# Patient Record
Sex: Female | Born: 1975 | Race: Black or African American | Hispanic: No | Marital: Single | Smoking: Never smoker
Health system: Southern US, Community
[De-identification: ages and names within clinical notes are randomized; demographics above are authoritative.]

## PROBLEM LIST (undated history)

## (undated) DIAGNOSIS — E7800 Pure hypercholesterolemia, unspecified: Secondary | ICD-10-CM

## (undated) DIAGNOSIS — D259 Leiomyoma of uterus, unspecified: Secondary | ICD-10-CM

## (undated) HISTORY — PX: TUBAL LIGATION: SHX77

## (undated) HISTORY — PX: REDUCTION MAMMAPLASTY: SUR839

---

## 2002-04-19 ENCOUNTER — Emergency Department (HOSPITAL_COMMUNITY): Admission: EM | Admit: 2002-04-19 | Discharge: 2002-04-19 | Payer: Self-pay | Admitting: Emergency Medicine

## 2003-11-05 ENCOUNTER — Ambulatory Visit (HOSPITAL_COMMUNITY): Admission: RE | Admit: 2003-11-05 | Discharge: 2003-11-05 | Payer: Self-pay | Admitting: Family Medicine

## 2006-05-07 ENCOUNTER — Emergency Department (HOSPITAL_COMMUNITY): Admission: EM | Admit: 2006-05-07 | Discharge: 2006-05-07 | Payer: Self-pay | Admitting: Emergency Medicine

## 2006-09-05 ENCOUNTER — Emergency Department (HOSPITAL_COMMUNITY): Admission: EM | Admit: 2006-09-05 | Discharge: 2006-09-05 | Payer: Self-pay | Admitting: Emergency Medicine

## 2006-11-19 ENCOUNTER — Encounter (INDEPENDENT_AMBULATORY_CARE_PROVIDER_SITE_OTHER): Payer: Self-pay | Admitting: Specialist

## 2006-11-19 ENCOUNTER — Other Ambulatory Visit: Admission: RE | Admit: 2006-11-19 | Discharge: 2006-11-19 | Payer: Self-pay | Admitting: Unknown Physician Specialty

## 2008-07-19 ENCOUNTER — Emergency Department (HOSPITAL_COMMUNITY): Admission: EM | Admit: 2008-07-19 | Discharge: 2008-07-19 | Payer: Self-pay | Admitting: Emergency Medicine

## 2009-03-26 ENCOUNTER — Emergency Department (HOSPITAL_COMMUNITY): Admission: EM | Admit: 2009-03-26 | Discharge: 2009-03-26 | Payer: Self-pay | Admitting: Emergency Medicine

## 2009-04-29 ENCOUNTER — Other Ambulatory Visit: Admission: RE | Admit: 2009-04-29 | Discharge: 2009-04-29 | Payer: Self-pay | Admitting: Obstetrics & Gynecology

## 2009-07-23 HISTORY — PX: MASS EXCISION: SHX2000

## 2009-07-27 ENCOUNTER — Ambulatory Visit (HOSPITAL_COMMUNITY): Admission: RE | Admit: 2009-07-27 | Discharge: 2009-07-27 | Payer: Self-pay | Admitting: Family Medicine

## 2009-10-08 ENCOUNTER — Emergency Department (HOSPITAL_COMMUNITY): Admission: EM | Admit: 2009-10-08 | Discharge: 2009-10-08 | Payer: Self-pay | Admitting: Emergency Medicine

## 2009-11-04 ENCOUNTER — Encounter: Payer: Self-pay | Admitting: Obstetrics & Gynecology

## 2009-11-04 ENCOUNTER — Inpatient Hospital Stay (HOSPITAL_COMMUNITY): Admission: RE | Admit: 2009-11-04 | Discharge: 2009-11-07 | Payer: Self-pay | Admitting: Obstetrics and Gynecology

## 2010-01-16 ENCOUNTER — Ambulatory Visit (HOSPITAL_COMMUNITY)
Admission: RE | Admit: 2010-01-16 | Discharge: 2010-01-16 | Payer: Self-pay | Source: Home / Self Care | Admitting: General Surgery

## 2010-10-08 LAB — BASIC METABOLIC PANEL
BUN: 12 mg/dL (ref 6–23)
CO2: 27 mEq/L (ref 19–32)
Calcium: 9.1 mg/dL (ref 8.4–10.5)
Chloride: 107 mEq/L (ref 96–112)
Creatinine, Ser: 1.02 mg/dL (ref 0.4–1.2)
GFR calc Af Amer: >60 mL/min (ref >60)
GFR calc non Af Amer: >60 mL/min (ref >60)
Glucose, Bld: 90 mg/dL (ref 70–99)
Potassium: 3.9 mEq/L (ref 3.5–5.1)
Sodium: 138 mEq/L (ref 135–145)

## 2010-10-08 LAB — CBC
HCT: 38.7 % (ref 36.0–46.0)
Hemoglobin: 13.1 g/dL (ref 12.0–15.0)
MCHC: 33.9 g/dL (ref 30.0–36.0)
MCV: 90.6 fL (ref 78.0–100.0)
Platelets: 204 10*3/uL (ref 150–400)
RBC: 4.28 MIL/uL (ref 3.87–5.11)
RDW: 13.8 % (ref 11.5–15.5)
WBC: 7.1 10*3/uL (ref 4.0–10.5)

## 2010-10-08 LAB — HCG, QUANTITATIVE, PREGNANCY: hCG, Beta Chain, Quant, S: <2 m[IU]/mL (ref <5)

## 2010-10-08 LAB — SURGICAL PCR SCREEN
MRSA, PCR: NEGATIVE
Staphylococcus aureus: NEGATIVE

## 2010-10-10 LAB — TYPE AND SCREEN
ABO/RH(D): O POS
Antibody Screen: NEGATIVE

## 2010-10-10 LAB — CBC
HCT: 32.1 % — ABNORMAL LOW (ref 36.0–46.0)
HCT: 32.2 % — ABNORMAL LOW (ref 36.0–46.0)
HCT: 34.4 % — ABNORMAL LOW (ref 36.0–46.0)
Hemoglobin: 10.6 g/dL — ABNORMAL LOW (ref 12.0–15.0)
Hemoglobin: 11.1 g/dL — ABNORMAL LOW (ref 12.0–15.0)
Hemoglobin: 11.8 g/dL — ABNORMAL LOW (ref 12.0–15.0)
MCHC: 32.9 g/dL (ref 30.0–36.0)
MCHC: 34.2 g/dL (ref 30.0–36.0)
MCHC: 34.6 g/dL (ref 30.0–36.0)
MCV: 94.6 fL (ref 78.0–100.0)
MCV: 94.9 fL (ref 78.0–100.0)
MCV: 96.3 fL (ref 78.0–100.0)
Platelets: 165 10*3/uL (ref 150–400)
Platelets: 182 10*3/uL (ref 150–400)
Platelets: 212 10*3/uL (ref 150–400)
RBC: 3.35 MIL/uL — ABNORMAL LOW (ref 3.87–5.11)
RBC: 3.4 MIL/uL — ABNORMAL LOW (ref 3.87–5.11)
RBC: 3.62 MIL/uL — ABNORMAL LOW (ref 3.87–5.11)
RDW: 14.2 % (ref 11.5–15.5)
RDW: 14.4 % (ref 11.5–15.5)
RDW: 14.4 % (ref 11.5–15.5)
WBC: 13.4 10*3/uL — ABNORMAL HIGH (ref 4.0–10.5)
WBC: 13.9 10*3/uL — ABNORMAL HIGH (ref 4.0–10.5)
WBC: 9.9 10*3/uL (ref 4.0–10.5)

## 2010-10-10 LAB — ABO/RH: ABO/RH(D): O POS

## 2010-10-10 LAB — RPR: RPR Ser Ql: NONREACTIVE

## 2010-10-16 LAB — URINALYSIS, ROUTINE W REFLEX MICROSCOPIC
Bilirubin Urine: NEGATIVE
Glucose, UA: NEGATIVE mg/dL
Hgb urine dipstick: NEGATIVE
Ketones, ur: NEGATIVE mg/dL
Nitrite: NEGATIVE
Protein, ur: NEGATIVE mg/dL
Specific Gravity, Urine: 1.01 (ref 1.005–1.030)
Urobilinogen, UA: 0.2 mg/dL (ref 0.0–1.0)
pH: 7 (ref 5.0–8.0)

## 2010-10-27 LAB — BASIC METABOLIC PANEL
BUN: 7 mg/dL (ref 6–23)
CO2: 25 mEq/L (ref 19–32)
Calcium: 9.7 mg/dL (ref 8.4–10.5)
Chloride: 104 mEq/L (ref 96–112)
Creatinine, Ser: 0.8 mg/dL (ref 0.4–1.2)
GFR calc Af Amer: >60 mL/min (ref >60)
GFR calc non Af Amer: >60 mL/min (ref >60)
Glucose, Bld: 93 mg/dL (ref 70–99)
Potassium: 3.4 mEq/L — ABNORMAL LOW (ref 3.5–5.1)
Sodium: 132 mEq/L — ABNORMAL LOW (ref 135–145)

## 2010-10-27 LAB — URINALYSIS, ROUTINE W REFLEX MICROSCOPIC
Bilirubin Urine: NEGATIVE
Glucose, UA: NEGATIVE mg/dL
Hgb urine dipstick: NEGATIVE
Ketones, ur: NEGATIVE mg/dL
Nitrite: NEGATIVE
Protein, ur: NEGATIVE mg/dL
Specific Gravity, Urine: >1.03 — ABNORMAL HIGH (ref 1.005–1.030)
Urobilinogen, UA: 1 mg/dL (ref 0.0–1.0)
pH: 6 (ref 5.0–8.0)

## 2010-10-27 LAB — DIFFERENTIAL
Basophils Absolute: 0 10*3/uL (ref 0.0–0.1)
Basophils Relative: 0 % (ref 0–1)
Eosinophils Absolute: 0.2 10*3/uL (ref 0.0–0.7)
Eosinophils Relative: 2 % (ref 0–5)
Lymphocytes Relative: 18 % (ref 12–46)
Lymphs Abs: 2.2 10*3/uL (ref 0.7–4.0)
Monocytes Absolute: 1 10*3/uL (ref 0.1–1.0)
Monocytes Relative: 8 % (ref 3–12)
Neutro Abs: 9.3 10*3/uL — ABNORMAL HIGH (ref 1.7–7.7)
Neutrophils Relative %: 73 % (ref 43–77)

## 2010-10-27 LAB — CBC
HCT: 40.6 % (ref 36.0–46.0)
Hemoglobin: 13.9 g/dL (ref 12.0–15.0)
MCHC: 34.3 g/dL (ref 30.0–36.0)
MCV: 92.7 fL (ref 78.0–100.0)
Platelets: 252 10*3/uL (ref 150–400)
RBC: 4.39 MIL/uL (ref 3.87–5.11)
RDW: 13.1 % (ref 11.5–15.5)
WBC: 12.8 10*3/uL — ABNORMAL HIGH (ref 4.0–10.5)

## 2010-10-27 LAB — WET PREP, GENITAL
Trich, Wet Prep: NONE SEEN
WBC, Wet Prep HPF POC: NONE SEEN
Yeast Wet Prep HPF POC: NONE SEEN

## 2010-10-27 LAB — HCG, QUANTITATIVE, PREGNANCY: hCG, Beta Chain, Quant, S: 57728 m[IU]/mL — ABNORMAL HIGH (ref <5)

## 2010-10-27 LAB — GC/CHLAMYDIA PROBE AMP, GENITAL
Chlamydia, DNA Probe: NEGATIVE
GC Probe Amp, Genital: NEGATIVE

## 2010-10-27 LAB — RPR: RPR Ser Ql: NONREACTIVE

## 2011-04-10 ENCOUNTER — Other Ambulatory Visit: Payer: Self-pay | Admitting: Obstetrics & Gynecology

## 2011-04-10 ENCOUNTER — Other Ambulatory Visit (HOSPITAL_COMMUNITY)
Admission: RE | Admit: 2011-04-10 | Discharge: 2011-04-10 | Disposition: A | Discharge status: Home / Self Care | Payer: Self-pay | Source: Ambulatory Visit | Attending: Obstetrics & Gynecology | Admitting: Obstetrics & Gynecology

## 2011-04-10 DIAGNOSIS — Z01419 Encounter for gynecological examination (general) (routine) without abnormal findings: Secondary | ICD-10-CM | POA: Insufficient documentation

## 2012-02-05 NOTE — H&P (Signed)
  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  Has been present for some time, but is increasing in size and causing her discomfort.  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  Review of Symptoms:  Constitutional:unremarkable   Head:unremarkable    Eyes:unremarkable   Nose/Mouth/Throat:unremarkable Cardiovascular:  unremarkable   Respiratory:unremarkable   Gastrointestinal:  unremarkable   Genitourinary:unremarkable     Musculoskeletal:unremarkable   Skin:unremarkable Hematolgic/Lymphatic:unremarkable     Allergic/Immunologic:unremarkable     Past Medical History:    Reviewed   Past Medical History  Surgical History: right axillary mass excision 2011 Medical Problems: unmarkable Allergies: nkda Medications: none   Social History:Reviewed  Social History  Preferred Language: English ([place]) Race:  Black or African American Ethnicity: Not Hispanic / Latino Age: 36 Years 9 Months Marital Status:  S Alcohol: unknown Recreational drug(s):  No   Smoking Status: Never smoker reviewed on 02/05/2012  Family History:  Reviewed   Family History  Is there a family history [id number]    Objective Information: General:  Well appearing, well nourished in no distress. Skin:     no rash or prominent lesions Heart:  RRR, no murmur Lungs:    CTA bilaterally, no wheezes, rhonchi, rales.  Breathing unlabored.     Left axilla with large diffuse subcutaneous mass present, no lymphadenopathy noted.  Assessment:Mass, left axilla  Diagnosis &amp; Procedure: DiagnosisCode: 239.2, ProcedureCode: 47829,    Plan:Scheduled for excision of mass, left axilla on 02/11/12.   Patient  Education:Alternative treatments to surgery were discussed with patient (and family).  Risks and benefits  of procedure were fully explained to the patient (and family) who gave informed consent. Patient/family questions were addressed.  Follow-up:Pending Surgery

## 2012-02-07 ENCOUNTER — Encounter (HOSPITAL_COMMUNITY)
Admission: RE | Admit: 2012-02-07 | Discharge: 2012-02-07 | Disposition: A | Discharge status: Home / Self Care | Payer: PRIVATE HEALTH INSURANCE | Source: Ambulatory Visit | Attending: General Surgery | Admitting: General Surgery

## 2012-02-07 ENCOUNTER — Encounter (HOSPITAL_COMMUNITY): Payer: Self-pay

## 2012-02-07 HISTORY — DX: Pure hypercholesterolemia, unspecified: E78.00

## 2012-02-07 LAB — CBC WITH DIFFERENTIAL/PLATELET
Basophils Absolute: 0 10*3/uL (ref 0.0–0.1)
Basophils Relative: 0 % (ref 0–1)
Eosinophils Absolute: 0.2 10*3/uL (ref 0.0–0.7)
Eosinophils Relative: 2 % (ref 0–5)
HCT: 38.4 % (ref 36.0–46.0)
Hemoglobin: 12.8 g/dL (ref 12.0–15.0)
Lymphocytes Relative: 28 % (ref 12–46)
Lymphs Abs: 2.5 10*3/uL (ref 0.7–4.0)
MCH: 30.6 pg (ref 26.0–34.0)
MCHC: 33.3 g/dL (ref 30.0–36.0)
MCV: 91.9 fL (ref 78.0–100.0)
Monocytes Absolute: 0.6 10*3/uL (ref 0.1–1.0)
Monocytes Relative: 7 % (ref 3–12)
Neutro Abs: 5.8 10*3/uL (ref 1.7–7.7)
Neutrophils Relative %: 63 % (ref 43–77)
Platelets: 282 10*3/uL (ref 150–400)
RBC: 4.18 MIL/uL (ref 3.87–5.11)
RDW: 12.9 % (ref 11.5–15.5)
WBC: 9.1 10*3/uL (ref 4.0–10.5)

## 2012-02-07 LAB — BASIC METABOLIC PANEL
BUN: 12 mg/dL (ref 6–23)
CO2: 26 mEq/L (ref 19–32)
Calcium: 9.9 mg/dL (ref 8.4–10.5)
Chloride: 104 mEq/L (ref 96–112)
Creatinine, Ser: 1 mg/dL (ref 0.50–1.10)
GFR calc Af Amer: 84 mL/min — ABNORMAL LOW (ref >90)
GFR calc non Af Amer: 72 mL/min — ABNORMAL LOW (ref >90)
Glucose, Bld: 101 mg/dL — ABNORMAL HIGH (ref 70–99)
Potassium: 4.1 mEq/L (ref 3.5–5.1)
Sodium: 138 mEq/L (ref 135–145)

## 2012-02-07 LAB — SURGICAL PCR SCREEN
MRSA, PCR: NEGATIVE
Staphylococcus aureus: POSITIVE — AB

## 2012-02-07 LAB — HCG, SERUM, QUALITATIVE: Preg, Serum: NEGATIVE

## 2012-02-07 MED ORDER — CHLORHEXIDINE GLUCONATE 4 % EX LIQD: 1.0000 "application " | Freq: Once | CUTANEOUS | Status: DC

## 2012-02-07 NOTE — Patient Instructions (Signed)
20 Chloe Orr  02/07/2012   Your procedure is scheduled on:  Monday, 02/11/12  Report to [facility] at 0730 AM.  Call this number if you have problems the morning of surgery: [phone]   Remember:   Do not eat food:After Midnight.  May have clear liquids:until Midnight .  Clear liquids include soda, tea, black coffee, apple or grape juice, broth.  Take these medicines the morning of surgery with A SIP OF WATER: none   Do not wear jewelry, make-up or nail polish.  Do not wear lotions, powders, or perfumes. You may wear deodorant.  Do not shave 48 hours prior to surgery. Men may shave face and neck.  Do not bring valuables to the hospital.  Contacts, dentures or bridgework may not be worn into surgery.  Leave suitcase in the car. After surgery it may be brought to your room.  For patients admitted to the hospital, checkout time is 11:00 AM the day of discharge.   Patients discharged the day of surgery will not be allowed to drive home.  Name and phone number of your driver: family  Special Instructions: CHG Shower Use Special Wash: 1/2 bottle night before surgery and 1/2 bottle morning of surgery.   Please read over the following fact sheets that you were given: Pain Booklet, MRSA Information, Surgical Site Infection Prevention, Anesthesia Post-op Instructions and Care and Recovery After Surgery PATIENT INSTRUCTIONS POST-ANESTHESIA  IMMEDIATELY FOLLOWING SURGERY:  Do not drive or operate machinery for the first twenty four hours after surgery.  Do not make any important decisions for twenty four hours after surgery or while taking narcotic pain medications or sedatives.  If you develop intractable nausea and vomiting or a severe headache please notify your doctor immediately.  FOLLOW-UP:  Please make an appointment with your surgeon as instructed. You do not need to follow up with anesthesia unless specifically instructed to do so.  WOUND CARE INSTRUCTIONS (if applicable):  Keep a  dry clean dressing on the anesthesia/puncture wound site if there is drainage.  Once the wound has quit draining you may leave it open to air.  Generally you should leave the bandage intact for twenty four hours unless there is drainage.  If the epidural site drains for more than 36-48 hours please call the anesthesia department.  QUESTIONS?:  Please feel free to call your physician or the hospital operator if you have any questions, and they will be happy to assist you.

## 2012-02-11 ENCOUNTER — Encounter (HOSPITAL_COMMUNITY): Payer: Self-pay | Admitting: Anesthesiology

## 2012-02-11 ENCOUNTER — Ambulatory Visit (HOSPITAL_COMMUNITY)
Admission: RE | Admit: 2012-02-11 | Discharge: 2012-02-11 | Disposition: A | Discharge status: Home / Self Care | Payer: PRIVATE HEALTH INSURANCE | Source: Ambulatory Visit | Attending: General Surgery | Admitting: General Surgery

## 2012-02-11 ENCOUNTER — Encounter (HOSPITAL_COMMUNITY)
Admission: RE | Disposition: A | Discharge status: Home / Self Care | Payer: Self-pay | Source: Ambulatory Visit | Attending: General Surgery

## 2012-02-11 ENCOUNTER — Encounter (HOSPITAL_COMMUNITY): Payer: Self-pay | Admitting: *Deleted

## 2012-02-11 ENCOUNTER — Ambulatory Visit (HOSPITAL_COMMUNITY): Payer: PRIVATE HEALTH INSURANCE | Admitting: Anesthesiology

## 2012-02-11 DIAGNOSIS — R229 Localized swelling, mass and lump, unspecified: Secondary | ICD-10-CM | POA: Insufficient documentation

## 2012-02-11 HISTORY — PX: MASS EXCISION: SHX2000

## 2012-02-11 SURGERY — EXCISION MASS
Anesthesia: General | Site: Axilla | Laterality: Left | Wound class: Clean

## 2012-02-11 MED ORDER — ONDANSETRON HCL 4 MG/2ML IJ SOLN
4.0000 mg | Freq: Once | INTRAMUSCULAR | Status: DC | PRN
Start: 2012-02-11 — End: 2012-02-11

## 2012-02-11 MED ORDER — KETOROLAC TROMETHAMINE 30 MG/ML IJ SOLN
INTRAMUSCULAR | Status: AC
  Filled 2012-02-11: qty 1

## 2012-02-11 MED ORDER — FENTANYL CITRATE 0.05 MG/ML IJ SOLN
INTRAMUSCULAR | Status: DC | PRN
  Administered 2012-02-11 (×6): 25 ug via INTRAVENOUS

## 2012-02-11 MED ORDER — BUPIVACAINE HCL (PF) 0.5 % IJ SOLN
INTRAMUSCULAR | Status: AC
  Filled 2012-02-11: qty 30

## 2012-02-11 MED ORDER — MIDAZOLAM HCL 2 MG/2ML IJ SOLN
INTRAMUSCULAR | Status: AC
  Filled 2012-02-11: qty 2

## 2012-02-11 MED ORDER — PROPOFOL 10 MG/ML IV EMUL
INTRAVENOUS | Status: DC | PRN
  Administered 2012-02-11: 180 mg via INTRAVENOUS

## 2012-02-11 MED ORDER — LACTATED RINGERS IV SOLN
INTRAVENOUS | Status: DC | PRN
  Administered 2012-02-11 (×2): via INTRAVENOUS

## 2012-02-11 MED ORDER — SODIUM CHLORIDE 0.9 % IR SOLN
Status: DC | PRN
  Administered 2012-02-11: 1000 mL

## 2012-02-11 MED ORDER — LACTATED RINGERS IV SOLN
INTRAVENOUS | Status: DC
  Administered 2012-02-11: 09:00:00 via INTRAVENOUS

## 2012-02-11 MED ORDER — PROPOFOL 10 MG/ML IV EMUL
INTRAVENOUS | Status: AC
  Filled 2012-02-11: qty 20

## 2012-02-11 MED ORDER — LIDOCAINE HCL (CARDIAC) 10 MG/ML IV SOLN
INTRAVENOUS | Status: DC | PRN
  Administered 2012-02-11: 50 mg via INTRAVENOUS

## 2012-02-11 MED ORDER — KETOROLAC TROMETHAMINE 30 MG/ML IJ SOLN
30.0000 mg | Freq: Once | INTRAMUSCULAR | Status: AC
  Administered 2012-02-11: 30 mg via INTRAVENOUS

## 2012-02-11 MED ORDER — FENTANYL CITRATE 0.05 MG/ML IJ SOLN
INTRAMUSCULAR | Status: AC
  Filled 2012-02-11: qty 2

## 2012-02-11 MED ORDER — BUPIVACAINE HCL (PF) 0.5 % IJ SOLN
INTRAMUSCULAR | Status: DC | PRN
  Administered 2012-02-11: 6 mL

## 2012-02-11 MED ORDER — FENTANYL CITRATE 0.05 MG/ML IJ SOLN
25.0000 ug | INTRAMUSCULAR | Status: DC | PRN
  Administered 2012-02-11 (×2): 50 ug via INTRAVENOUS

## 2012-02-11 MED ORDER — MUPIROCIN 2 % EX OINT
TOPICAL_OINTMENT | CUTANEOUS | Status: AC
  Filled 2012-02-11: qty 22

## 2012-02-11 MED ORDER — KETOROLAC TROMETHAMINE 30 MG/ML IJ SOLN: 30.0000 mg | Freq: Once | INTRAMUSCULAR | Status: DC

## 2012-02-11 MED ORDER — MIDAZOLAM HCL 2 MG/2ML IJ SOLN
1.0000 mg | INTRAMUSCULAR | Status: DC | PRN
Start: 2012-02-11 — End: 2012-02-11
  Administered 2012-02-11: 2 mg via INTRAVENOUS

## 2012-02-11 MED ORDER — LIDOCAINE HCL (PF) 1 % IJ SOLN
INTRAMUSCULAR | Status: AC
  Filled 2012-02-11: qty 5

## 2012-02-11 MED ORDER — ARTIFICIAL TEARS OP OINT
TOPICAL_OINTMENT | OPHTHALMIC | Status: AC
  Filled 2012-02-11: qty 3.5

## 2012-02-11 MED ORDER — OXYCODONE-ACETAMINOPHEN 7.5-325 MG PO TABS: 1.0000 | ORAL_TABLET | ORAL | Status: AC | PRN

## 2012-02-11 SURGICAL SUPPLY — 35 items
BAG HAMPER (MISCELLANEOUS) ×2 IMPLANT
BNDG COHESIVE 4X5 TAN STRL (GAUZE/BANDAGES/DRESSINGS) ×2 IMPLANT
BNDG CONFORM 6X.82 1P STRL (GAUZE/BANDAGES/DRESSINGS) ×2 IMPLANT
CLOTH BEACON ORANGE TIMEOUT ST (SAFETY) ×2 IMPLANT
COVER LIGHT HANDLE STERIS (MISCELLANEOUS) ×4 IMPLANT
DECANTER SPIKE VIAL GLASS SM (MISCELLANEOUS) ×2 IMPLANT
DERMABOND ADVANCED (GAUZE/BANDAGES/DRESSINGS) ×1
DERMABOND ADVANCED .7 DNX12 (GAUZE/BANDAGES/DRESSINGS) ×1 IMPLANT
DRAPE PROXIMA HALF (DRAPES) ×2 IMPLANT
DURAPREP 26ML APPLICATOR (WOUND CARE) ×4 IMPLANT
ELECT NEEDLE TIP 2.8 STRL (NEEDLE) IMPLANT
ELECT REM PT RETURN 9FT ADLT (ELECTROSURGICAL) ×2
ELECTRODE REM PT RTRN 9FT ADLT (ELECTROSURGICAL) ×1 IMPLANT
FORMALIN 10 PREFIL 120ML (MISCELLANEOUS) IMPLANT
GLOVE BIO SURGEON STRL SZ7.5 (GLOVE) ×2 IMPLANT
GLOVE BIOGEL PI IND STRL 7.0 (GLOVE) ×1 IMPLANT
GLOVE BIOGEL PI INDICATOR 7.0 (GLOVE) ×1
GLOVE ECLIPSE 6.5 STRL STRAW (GLOVE) ×2 IMPLANT
GLOVE EXAM NITRILE MD LF STRL (GLOVE) ×2 IMPLANT
GOWN STRL REIN XL XLG (GOWN DISPOSABLE) ×4 IMPLANT
KIT ROOM TURNOVER APOR (KITS) ×2 IMPLANT
MANIFOLD NEPTUNE II (INSTRUMENTS) ×2 IMPLANT
NEEDLE HYPO 25X1 1.5 SAFETY (NEEDLE) ×2 IMPLANT
NS IRRIG 1000ML POUR BTL (IV SOLUTION) ×2 IMPLANT
PACK MINOR (CUSTOM PROCEDURE TRAY) ×2 IMPLANT
PAD ARMBOARD 7.5X6 YLW CONV (MISCELLANEOUS) ×2 IMPLANT
SET BASIN LINEN APH (SET/KITS/TRAYS/PACK) ×2 IMPLANT
STOCKINETTE IMPERVIOUS LG (DRAPES) ×2 IMPLANT
SUT ETHILON 3 0 FSL (SUTURE) IMPLANT
SUT PROLENE 4 0 PS 2 18 (SUTURE) IMPLANT
SUT VIC AB 3-0 SH 27 (SUTURE) ×3
SUT VIC AB 3-0 SH 27X BRD (SUTURE) ×3 IMPLANT
SUT VIC AB 4-0 PS2 27 (SUTURE) ×4 IMPLANT
SYR BULB IRRIGATION 50ML (SYRINGE) ×2 IMPLANT
SYR CONTROL 10ML LL (SYRINGE) ×2 IMPLANT

## 2012-02-11 NOTE — Interval H&P Note (Signed)
History and Physical Interval Note:  02/11/2012 8:50 AM  Chloe Orr  has presented today for surgery, with the diagnosis of Mass of axilla  The various methods of treatment have been discussed with the patient and family. After consideration of risks, benefits and other options for treatment, the patient has consented to  Procedure(s) (LRB): EXCISION MASS (Left) as a surgical intervention .  The patient's history has been reviewed, patient examined, no change in status, stable for surgery.  I have reviewed the patient's chart and labs.  Questions were answered to the patient's satisfaction.     Franky Macho A

## 2012-02-11 NOTE — Anesthesia Preprocedure Evaluation (Signed)
Anesthesia Evaluation  Patient identified by MRN, date of birth, ID band Patient awake    History of Anesthesia Complications Negative for: history of anesthetic complications  Airway Mallampati: II      Dental  (+) Teeth Intact   Pulmonary neg pulmonary ROS,  breath sounds clear to auscultation        Cardiovascular negative cardio ROS  Rhythm:Regular     Neuro/Psych    GI/Hepatic negative GI ROS,   Endo/Other    Renal/GU      Musculoskeletal   Abdominal   Peds  Hematology   Anesthesia Other Findings   Reproductive/Obstetrics                           Anesthesia Physical Anesthesia Plan  ASA: I  Anesthesia Plan: General   Post-op Pain Management:    Induction: Intravenous  Airway Management Planned: LMA  Additional Equipment:   Intra-op Plan:   Post-operative Plan: Extubation in OR  Informed Consent: I have reviewed the patients History and Physical, chart, labs and discussed the procedure including the risks, benefits and alternatives for the proposed anesthesia with the patient or authorized representative who has indicated his/her understanding and acceptance.     Plan Discussed with:   Anesthesia Plan Comments:         Anesthesia Quick Evaluation

## 2012-02-11 NOTE — Anesthesia Procedure Notes (Signed)
Procedure Name: LMA Insertion Date/Time: 02/11/2012 9:26 AM Performed by: Carolyne Littles, AMY L Pre-anesthesia Checklist: Patient identified, Patient being monitored, Emergency Drugs available, Timeout performed and Suction available Patient Re-evaluated:Patient Re-evaluated prior to inductionOxygen Delivery Method: Circle system utilized Preoxygenation: Pre-oxygenation with 100% oxygen Intubation Type: IV induction Ventilation: Mask ventilation without difficulty LMA: LMA inserted LMA Size: 4.0 Number of attempts: 1 Placement Confirmation: positive ETCO2 and breath sounds checked- equal and bilateral Tube secured with: Tape Dental Injury: Teeth and Oropharynx as per pre-operative assessment

## 2012-02-11 NOTE — Transfer of Care (Signed)
Immediate Anesthesia Transfer of Care Note  Patient: Chloe Orr  Procedure(s) Performed: Procedure(s) (LRB): EXCISION MASS (Left)  Patient Location: PACU  Anesthesia Type: General  Level of Consciousness: awake, alert , oriented and patient cooperative  Airway & Oxygen Therapy: Patient Spontanous Breathing and Patient connected to face mask oxygen  Post-op Assessment: Report given to PACU RN and Post -op Vital signs reviewed and stable  Post vital signs: Reviewed and stable  Complications: No apparent anesthesia complications

## 2012-02-11 NOTE — Preoperative (Signed)
Beta Blockers   Reason not to administer Beta Blockers:Not Applicable 

## 2012-02-11 NOTE — Op Note (Signed)
Patient:  REIGNA RUPERTO  DOB:  [birth year]  MRN:  [id number]   Preop Diagnosis:  Neoplasm, left axilla  Postop Diagnosis:  Same  Procedure:  Excision of neoplasm, left axilla, greater than 4 cm  Surgeon:  Franky Macho, M.D.  Anes:  General  Indications:  Patient is a 36 year old black female who presents with an enlarging subcutaneous mass in the left axilla. The risks and benefits of the procedure including bleeding, infection, and recurrence of the mass were fully explained to the patient, gave informed consent.   Procedure note:  Patient was placed in the supine position. After general anesthesia was administered, left axilla was prepped and draped using usual sterile technique with DuraPrep. Surgical site confirmation was performed.  An incision was made in the left axilla. This was taken down to the subcutaneous tissue. Several areas of well formed pockets of adipose tissue and breast tissue were found. These were excised without difficulty and sent to pathology for further examination. They appear to be benign in nature. The wound was irrigated normal saline. The subcutaneous layer was reapproximated using 2-0 Vicryl interrupted suture. Excess skin was removed and the skin was closed using 4-0 Vicryl subcuticular suture. 0.5% Sensorcaine was instilled the surrounding wound. Dermabond was then applied.  All tape and needle counts were correct at the end of the procedure. Patient was awakened and transferred to PACU in stable condition.  Complications:  None  EBL:  Minimal  Specimen:  Left axillary tissue

## 2012-02-11 NOTE — Anesthesia Postprocedure Evaluation (Signed)
  Anesthesia Post-op Note  Patient: Chloe Orr  Procedure(s) Performed: Procedure(s) (LRB): EXCISION MASS (Left)  Patient Location: PACU  Anesthesia Type: General  Level of Consciousness: awake, alert , oriented and patient cooperative  Airway and Oxygen Therapy: Patient Spontanous Breathing and Patient connected to face mask oxygen  Post-op Pain: mild  Post-op Assessment: Post-op Vital signs reviewed, Patient's Cardiovascular Status Stable, Respiratory Function Stable, Patent Airway and No signs of Nausea or vomiting  Post-op Vital Signs: Reviewed and stable  Complications: No apparent anesthesia complications

## 2012-02-15 ENCOUNTER — Encounter (HOSPITAL_COMMUNITY): Payer: Self-pay | Admitting: General Surgery

## 2013-10-01 ENCOUNTER — Emergency Department (HOSPITAL_COMMUNITY)
Admission: EM | Admit: 2013-10-01 | Discharge: 2013-10-01 | Disposition: A | Discharge status: Home / Self Care | Payer: BC Managed Care – PPO | Source: Home / Self Care | Attending: Family Medicine | Admitting: Family Medicine

## 2013-10-01 ENCOUNTER — Encounter (HOSPITAL_COMMUNITY): Payer: Self-pay | Admitting: Emergency Medicine

## 2013-10-01 DIAGNOSIS — T148 Other injury of unspecified body region: Secondary | ICD-10-CM

## 2013-10-01 DIAGNOSIS — W57XXXA Bitten or stung by nonvenomous insect and other nonvenomous arthropods, initial encounter: Secondary | ICD-10-CM

## 2013-10-01 MED ORDER — CEPHALEXIN 500 MG PO CAPS: 500.0000 mg | ORAL_CAPSULE | Freq: Three times a day (TID) | ORAL | Status: DC

## 2013-10-01 MED ORDER — FLUTICASONE PROPIONATE 0.05 % EX CREA: TOPICAL_CREAM | Freq: Two times a day (BID) | CUTANEOUS | Status: DC

## 2013-10-01 NOTE — ED Provider Notes (Signed)
CSN: [id number]     Arrival date & time 10/01/13  1856 History   First MD Initiated Contact with Patient 10/01/13 1919     Chief Complaint  Patient presents with  . Insect Bite   (Consider location/radiation/quality/duration/timing/severity/associated sxs/prior Treatment) Patient is a 38 y.o. female presenting with rash. The history is provided by the patient.  Rash Location:  Head/neck Head/neck rash location:  R neck Quality: itchiness, redness and swelling   Severity:  Mild Onset quality:  Sudden Duration:  1 day Progression:  Unchanged Chronicity:  New Context: insect bite/sting   Associated symptoms: induration   Associated symptoms: no fever, no shortness of breath, no throat swelling and no tongue swelling     Past Medical History  Diagnosis Date  . Hypercholesteremia    Past Surgical History  Procedure Laterality Date  . Tubal ligation    . Cesarean section  1996, 2010    [facility]  . Mass excision  2011    right arm mass excision  . Mass excision  02/11/2012    Procedure: EXCISION MASS;  Surgeon: Jamesetta So, MD;  Location: [facility];  Service: General;  Laterality: Left;  Excision Neoplasm Left Axilla   History reviewed. No pertinent family history. History  Substance Use Topics  . Smoking status: Never Smoker   . Smokeless tobacco: Not on file  . Alcohol Use: Yes     Comment: rarely   OB History   Grav Para Term Preterm Abortions TAB SAB Ect Mult Living                 Review of Systems  Constitutional: Negative.  Negative for fever.  Respiratory: Negative for shortness of breath.   Skin: Positive for rash.    Allergies  Review of patient's allergies indicates no known allergies.  Home Medications   Current Outpatient Rx  Name  Route  Sig  Dispense  Refill  . cephALEXin (KEFLEX) 500 MG capsule   Oral   Take 1 capsule (500 mg total) by mouth 3 (three) times daily. Take all of medicine and drink lots of fluids   15 capsule   0   .  fluticasone (CUTIVATE) 0.05 % cream   Topical   Apply topically 2 (two) times daily.   30 g   0   . simvastatin (ZOCOR) 20 MG tablet   Oral   Take 20 mg by mouth at bedtime.          BP 143/88  Pulse 89  Temp(Src) 98.4 F (36.9 C) (Oral)  Resp 18  SpO2 98%  LMP 09/16/2013 Physical Exam  Nursing note and vitals reviewed. Constitutional: She is oriented to person, place, and time. She appears well-developed and well-nourished.  HENT:  Mouth/Throat: Oropharynx is clear and moist.  Neck: Normal range of motion. Neck supple.  Pulmonary/Chest: Breath sounds normal.  Lymphadenopathy:    She has no cervical adenopathy.  Neurological: She is alert and oriented to person, place, and time.  Skin: Skin is warm and dry. Rash noted.  2 lesions to right neck , indurated, pruritic, raised.sm central blister.    ED Course  Procedures (including critical care time) Labs Review Labs Reviewed - No data to display Imaging Review No results found.   MDM   1. Multiple insect bites        Billy Fischer, MD 10/01/13 2048

## 2013-10-01 NOTE — ED Notes (Signed)
C/O INSECT BITE ON RIGHT SIDE OF NECK SINCE YESTERDAY STATES AREA IS HOT, ITCHY, AND SWOLLEN CREAM AND ALCOHOL WAS USED AS [place]

## 2014-01-18 ENCOUNTER — Encounter (HOSPITAL_COMMUNITY): Payer: Self-pay | Admitting: Emergency Medicine

## 2014-01-18 ENCOUNTER — Emergency Department (HOSPITAL_COMMUNITY)
Admission: EM | Admit: 2014-01-18 | Discharge: 2014-01-18 | Disposition: A | Discharge status: Home / Self Care | Payer: BC Managed Care – PPO | Attending: Emergency Medicine | Admitting: Emergency Medicine

## 2014-01-18 DIAGNOSIS — H11429 Conjunctival edema, unspecified eye: Secondary | ICD-10-CM | POA: Insufficient documentation

## 2014-01-18 DIAGNOSIS — E7800 Pure hypercholesterolemia, unspecified: Secondary | ICD-10-CM | POA: Insufficient documentation

## 2014-01-18 DIAGNOSIS — H5710 Ocular pain, unspecified eye: Secondary | ICD-10-CM | POA: Insufficient documentation

## 2014-01-18 DIAGNOSIS — IMO0002 Reserved for concepts with insufficient information to code with codable children: Secondary | ICD-10-CM | POA: Insufficient documentation

## 2014-01-18 DIAGNOSIS — T4995XA Adverse effect of unspecified topical agent, initial encounter: Secondary | ICD-10-CM | POA: Insufficient documentation

## 2014-01-18 DIAGNOSIS — H11421 Conjunctival edema, right eye: Secondary | ICD-10-CM

## 2014-01-18 DIAGNOSIS — T7840XA Allergy, unspecified, initial encounter: Secondary | ICD-10-CM

## 2014-01-18 DIAGNOSIS — Z79899 Other long term (current) drug therapy: Secondary | ICD-10-CM | POA: Insufficient documentation

## 2014-01-18 MED ORDER — PREDNISONE 20 MG PO TABS: 60.0000 mg | ORAL_TABLET | Freq: Once | ORAL | Status: DC

## 2014-01-18 MED ORDER — FLUORESCEIN SODIUM 1 MG OP STRP
1.0000 | ORAL_STRIP | Freq: Once | OPHTHALMIC | Status: AC
  Administered 2014-01-18: 1 via OPHTHALMIC
  Filled 2014-01-18: qty 1

## 2014-01-18 MED ORDER — FAMOTIDINE 20 MG PO TABS: 20.0000 mg | ORAL_TABLET | Freq: Two times a day (BID) | ORAL | Status: DC

## 2014-01-18 MED ORDER — TETRACAINE HCL 0.5 % OP SOLN
1.0000 [drp] | Freq: Once | OPHTHALMIC | Status: AC
  Administered 2014-01-18: 2 [drp] via OPHTHALMIC
  Filled 2014-01-18: qty 2

## 2014-01-18 MED ORDER — NEOMYCIN-POLYMYXIN-HC 3.5-10000-1 OP SUSP: 4.0000 [drp] | Freq: Four times a day (QID) | OPHTHALMIC | Status: DC

## 2014-01-18 MED ORDER — PREDNISONE 20 MG PO TABS: 40.0000 mg | ORAL_TABLET | Freq: Every day | ORAL | Status: DC

## 2014-01-18 MED ORDER — DIPHENHYDRAMINE HCL 25 MG PO TABS: 25.0000 mg | ORAL_TABLET | Freq: Four times a day (QID) | ORAL | Status: DC | PRN

## 2014-01-18 MED ORDER — FAMOTIDINE 20 MG PO TABS
20.0000 mg | ORAL_TABLET | Freq: Once | ORAL | Status: AC
  Administered 2014-01-18: 20 mg via ORAL
  Filled 2014-01-18: qty 1

## 2014-01-18 MED ORDER — DEXAMETHASONE SODIUM PHOSPHATE 10 MG/ML IJ SOLN
10.0000 mg | Freq: Once | INTRAMUSCULAR | Status: AC
  Administered 2014-01-18: 10 mg via INTRAMUSCULAR
  Filled 2014-01-18: qty 1

## 2014-01-18 NOTE — ED Notes (Signed)
Pt reports approx one hour ago her right eye began to itch and she was scratching it. Reports its no longer itching or hurting but it "feels funny." pt has moderate swelling noted to her eye but also her sclera. Redness noted, denies vision changes.

## 2014-01-18 NOTE — Discharge Instructions (Signed)
1. Medications: prednisone, benadryl, Pepcid, cortisporin, usual home medications 2. Treatment: rest, drink plenty of fluids,  3. Follow Up: Please followup with your opthalmologist in 24 hours for discussion of your diagnoses and further evaluation after today's visit;  Allergies Allergies may happen from anything your body is sensitive to. This may be food, medicines, pollens, chemicals, and nearly anything around you in everyday life that produces allergens. An allergen is anything that causes an allergy producing substance. Heredity is often a factor in causing these problems. This means you may have some of the same allergies as your parents. Food allergies happen in all age groups. Food allergies are some of the most severe and life threatening. Some common food allergies are cow's milk, seafood, eggs, nuts, wheat, and soybeans. SYMPTOMS   Swelling around the mouth.  An itchy red rash or hives.  Vomiting or diarrhea.  Difficulty breathing. SEVERE ALLERGIC REACTIONS ARE LIFE-THREATENING. This reaction is called anaphylaxis. It can cause the mouth and throat to swell and cause difficulty with breathing and swallowing. In severe reactions only a trace amount of food (for example, peanut oil in a salad) may cause death within seconds. Seasonal allergies occur in all age groups. These are seasonal because they usually occur during the same season every year. They may be a reaction to molds, grass pollens, or tree pollens. Other causes of problems are house dust mite allergens, pet dander, and mold spores. The symptoms often consist of nasal congestion, a runny itchy nose associated with sneezing, and tearing itchy eyes. There is often an associated itching of the mouth and ears. The problems happen when you come in contact with pollens and other allergens. Allergens are the particles in the air that the body reacts to with an allergic reaction. This causes you to release allergic antibodies.  Through a chain of events, these eventually cause you to release histamine into the blood stream. Although it is meant to be protective to the body, it is this release that causes your discomfort. This is why you were given anti-histamines to feel better. If you are unable to pinpoint the offending allergen, it may be determined by skin or blood testing. Allergies cannot be cured but can be controlled with medicine. Hay fever is a collection of all or some of the seasonal allergy problems. It may often be treated with simple over-the-counter medicine such as diphenhydramine. Take medicine as directed. Do not drink alcohol or drive while taking this medicine. Check with your caregiver or package insert for child dosages. If these medicines are not effective, there are many new medicines your caregiver can prescribe. Stronger medicine such as nasal spray, eye drops, and corticosteroids may be used if the first things you try do not work well. Other treatments such as immunotherapy or desensitizing injections can be used if all else fails. Follow up with your caregiver if problems continue. These seasonal allergies are usually not life threatening. They are generally more of a nuisance that can often be handled using medicine. HOME CARE INSTRUCTIONS   If unsure what causes a reaction, keep a diary of foods eaten and symptoms that follow. Avoid foods that cause reactions.  If hives or rash are present:  Take medicine as directed.  You may use an over-the-counter antihistamine (diphenhydramine) for hives and itching as needed.  Apply cold compresses (cloths) to the skin or take baths in cool water. Avoid hot baths or showers. Heat will make a rash and itching worse.  If you are  severely allergic:  Following a treatment for a severe reaction, hospitalization is often required for closer follow-up.  Wear a medic-alert bracelet or necklace stating the allergy.  You and your family must learn how to  give adrenaline or use an anaphylaxis kit.  If you have had a severe reaction, always carry your anaphylaxis kit or EpiPen with you. Use this medicine as directed by your caregiver if a severe reaction is occurring. Failure to do so could have a fatal outcome. SEEK MEDICAL CARE IF:  You suspect a food allergy. Symptoms generally happen within 30 minutes of eating a food.  Your symptoms have not gone away within 2 days or are getting worse.  You develop new symptoms.  You want to retest yourself or your child with a food or drink you think causes an allergic reaction. Never do this if an anaphylactic reaction to that food or drink has happened before. Only do this under the care of a caregiver. SEEK IMMEDIATE MEDICAL CARE IF:   You have difficulty breathing, are wheezing, or have a tight feeling in your chest or throat.  You have a swollen mouth, or you have hives, swelling, or itching all over your body.  You have had a severe reaction that has responded to your anaphylaxis kit or an EpiPen. These reactions may return when the medicine has worn off. These reactions should be considered life threatening. MAKE SURE YOU:   Understand these instructions.  Will watch your condition.  Will get help right away if you are not doing well or get worse. Document Released: 10/02/2002 Document Revised: 11/03/2012 Document Reviewed: 03/08/2008 [facility] Patient Information 2015 [place], [place]. This information is not intended to replace advice given to you by your health care provider. Make sure you discuss any questions you have with your health care provider.

## 2014-01-18 NOTE — ED Provider Notes (Signed)
CSN: [id number]     Arrival date & time 01/18/14  1533 History  This chart was scribed for non-physician practitioner Theodis Sato, PA-C working with [place], DO by Eston Mould, ED Scribe. This patient was seen in room TR04C/TR04C and the patient's care was started at 5:49 PM .   Chief Complaint  Patient presents with  . Eye Problem   The history is provided by the patient. No language interpreter was used.   HPI Comments: Chloe Orr is a 38 y.o. female who presents to the Emergency Department complaining of sudden R eye problem that began 1 hour ago. Pt states her R eye began to itch 1 hour ago and states she began rubbing eye; she reports touching her dog before and afterwards. She attempted to take a nap but reports sx worsened. She now has swelling, redness, and sclera appears inverted to R eye. She reports having an EpiPen due to allergies with dust mites and weeds, but does not have it at home and did not take it. Denies having itching and pain at this moment. She also denies vision changes, facial swelling, SOB, swelling of her throat, difficulty breathing, and allergies to her dog.  Past Medical History  Diagnosis Date  . Hypercholesteremia    Past Surgical History  Procedure Laterality Date  . Tubal ligation    . Cesarean section  1996, 2010    [facility]  . Mass excision  2011    right arm mass excision  . Mass excision  02/11/2012    Procedure: EXCISION MASS;  Surgeon: Jamesetta So, MD;  Location: [facility];  Service: General;  Laterality: Left;  Excision Neoplasm Left Axilla   History reviewed. No pertinent family history. History  Substance Use Topics  . Smoking status: Never Smoker   . Smokeless tobacco: Not on file  . Alcohol Use: Yes     Comment: rarely   OB History   Grav Para Term Preterm Abortions TAB SAB Ect Mult Living                 Review of Systems  Constitutional: Negative for fever.  HENT: Negative for drooling and  facial swelling.   Eyes: Positive for pain, redness and itching. Negative for photophobia, discharge and visual disturbance.  Respiratory: Negative for chest tightness, shortness of breath and wheezing.   Cardiovascular: Negative for chest pain.  Gastrointestinal: Negative for nausea, vomiting and abdominal pain.  Skin: Negative for rash.  Allergic/Immunologic: Negative for immunocompromised state.  Neurological: Negative for light-headedness and headaches.  Hematological: Does not bruise/bleed easily.   Allergies  Review of patient's allergies indicates no known allergies.  Home Medications   Prior to Admission medications   Medication Sig Start Date End Date Taking? Authorizing Etoy Mcdonnell  diphenhydrAMINE (SOMINEX) 25 MG tablet Take 25 mg by mouth at bedtime as needed for itching. For infection in eye   Yes Historical Chenel Wernli, MD  simvastatin (ZOCOR) 20 MG tablet Take 20 mg by mouth at bedtime.   Yes Historical Jolyne Laye, MD  diphenhydrAMINE (BENADRYL) 25 MG tablet Take 1 tablet (25 mg total) by mouth every 6 (six) hours as needed for itching (Rash). 01/18/14   Hannah Muthersbaugh, PA-C  famotidine (PEPCID) 20 MG tablet Take 1 tablet (20 mg total) by mouth 2 (two) times daily. 01/18/14   Hannah Muthersbaugh, PA-C  neomycin-polymyxin-hydrocortisone (CORTISPORIN) 3.5-10000-1 ophthalmic suspension Place 4 drops into the left eye 4 (four) times daily. 01/18/14   Jarrett Soho Muthersbaugh, PA-C  predniSONE (DELTASONE) 20 MG tablet Take 2 tablets (40 mg total) by mouth daily. 01/18/14   Hannah Muthersbaugh, PA-C   BP 128/90  Pulse 81  Temp(Src) 98.6 F (37 C) (Oral)  Resp 18  SpO2 98%  LMP 01/12/2014  Physical Exam  Nursing note and vitals reviewed. Constitutional: She appears well-developed and well-nourished. No distress.  Awake, alert, nontoxic appearance  HENT:  Head: Normocephalic and atraumatic.  Mouth/Throat: Oropharynx is clear and moist. No oropharyngeal exudate.  Eyes: Conjunctivae,  EOM and lids are normal. Pupils are equal, round, and reactive to light. Lids are everted and swept, no foreign bodies found. Right eye exhibits chemosis. Right eye exhibits no discharge, no exudate and no hordeolum. No foreign body present in the right eye. Left eye exhibits no chemosis, no discharge, no exudate and no hordeolum. No foreign body present in the left eye. Right conjunctiva is not injected. Right conjunctiva has no hemorrhage. Left conjunctiva is not injected. No scleral icterus. Right eye exhibits normal extraocular motion. Left eye exhibits normal extraocular motion.  Slit lamp exam:      The right eye shows no corneal abrasion, no corneal flare, no corneal ulcer, no foreign body, no fluorescein uptake and no anterior chamber bulge.       The left eye shows no corneal abrasion, no corneal flare, no corneal ulcer, no foreign body, no fluorescein uptake and no anterior chamber bulge.    Swelling and very mild erythema without induration to the periorbital tissue of the R eye with associated chemosis without injection of the sclera. PERRL. EOM intact without pain or diplopia. She has no photophobia.    Visual Acuity: Bilateral Near: 20/20  Bilateral Distance: 40/20  R Near: 20/20  R Distance: 40/20 L Near: 20/20 L Distance: 70/20         Neck: Normal range of motion. Neck supple.  Patent airway. No stridor. Handling secretions without difficulty.  Cardiovascular: Normal rate, regular rhythm and intact distal pulses.   Pulmonary/Chest: Effort normal and breath sounds normal. No stridor. No respiratory distress. She has no wheezes.  No wheezing.  Abdominal: Soft. Bowel sounds are normal. She exhibits no mass. There is no tenderness. There is no rebound and no guarding.  Musculoskeletal: Normal range of motion. She exhibits no edema.  Neurological: She is alert.  Speech is clear and goal oriented Moves extremities without ataxia  Skin: Skin is warm and dry. She is not  diaphoretic.  No urticaria or hives to the face or extremities.  Psychiatric: She has a normal mood and affect.   ED Course  Procedures  DIAGNOSTIC STUDIES: Oxygen Saturation is 98% on RA, normal by my interpretation.    COORDINATION OF CARE: 5:49 PM-Discussed treatment plan which includes evaluate pt after receiving Prednisone, 50-mg of Benadryl and Pepcid in triage. Will discharge pt with Benadryl x 6/hours, Pepcid, Prednisone, and eye drops. Pt agreed to plan.   6:25 PM-Evaluated pts eye with Sherral Hammers Lamp; no scratches noted. Reccommended pt to F/U with Opthamologist. Pt states she is feeling better and has noticed she is able to "open her eyes wide". Advised pt to come to the ED if she presents with fevers, redness, or inability to see.  Labs Review Labs Reviewed - No data to display  Imaging Review No results found.   EKG Interpretation None     MDM   Final diagnoses:  Allergic reaction, initial encounter  Chemosis of right conjunctiva    Chloe Orr presents with chemosis  of the right eye after petting her dog then rubbing her eye.  Pt reports significant allergies to dust mites and other and her mental problems. She reports that initially her eye was itchy but is no longer. She denies blurred vision, diplopia or pain with extraocular movements.  Physical exam was significant chemosis of the right eye and soft tissue swelling of the. Little tissue without erythema or induration. No evidence of periorbital cellulitis or secondary infection. Will give prednisone, Benadryl, Pepcid and reassess.  5:10PM Pt reports she is feeling better.  Moderate decrease in the periorbital swelling and mild decrease in the chemosis.  Pt with normal vision in the right eye and decreased vision in the left eye at baseline. She does not wear contacts.    6:05PM Pt continues to feel better.  No evidence of abrasion or ulceration on fluorescein exam.  Pt to be d/c home with allergic reaction  treatments, cortisporin and strict instructions to f/u with opthalmology.  Patient re-evaluated prior to dc, is hemodynamically stable, in no respiratory distress, and denies the feeling of throat closing. Pt has been advised to take OTC benadryl & return to the ED if they have a mod-severe allergic rxn (s/s including throat closing, difficulty breathing, swelling of lips face or tongue). Pt is to follow up with their PCP. Pt is agreeable with plan & verbalizes understanding.   I have personally reviewed patient's vitals, nursing note and any pertinent labs or imaging.  I performed an undressed physical exam.    At this time, it has been determined that no acute conditions requiring further emergency intervention. The patient/guardian have been advised of the diagnosis and plan. I reviewed all labs and imaging including any potential incidental findings. We have discussed signs and symptoms that warrant return to the ED, such as those listed above.  Patient/guardian has voiced understanding and agreed to follow-up with the PCP or specialist in 24 hours.  Vital signs are stable at discharge.   BP 128/90  Pulse 81  Temp(Src) 98.6 F (37 C) (Oral)  Resp 18  SpO2 98%  LMP 01/12/2014        Abigail Butts, PA-C 01/19/14 [phone]

## 2014-01-19 NOTE — ED Provider Notes (Signed)
Medical screening examination/treatment/procedure(s) were performed by non-physician practitioner and as supervising physician I was immediately available for consultation/collaboration.   EKG Interpretation None        Delice Bison Ward, DO 01/19/14 1526

## 2014-09-02 ENCOUNTER — Encounter: Payer: Self-pay | Admitting: Gastroenterology

## 2014-09-02 ENCOUNTER — Encounter (INDEPENDENT_AMBULATORY_CARE_PROVIDER_SITE_OTHER): Payer: Self-pay

## 2014-09-02 ENCOUNTER — Ambulatory Visit (INDEPENDENT_AMBULATORY_CARE_PROVIDER_SITE_OTHER): Payer: Self-pay | Admitting: Gastroenterology

## 2014-09-02 VITALS — BP 145/93 | HR 98 | Temp 98.0°F | Ht 59.0 in | Wt 157.8 lb

## 2014-09-02 DIAGNOSIS — B029 Zoster without complications: Secondary | ICD-10-CM

## 2014-09-02 MED ORDER — GABAPENTIN 100 MG PO CAPS: ORAL_CAPSULE | ORAL | Status: DC

## 2014-09-02 MED ORDER — PROMETHAZINE HCL 12.5 MG PO TABS: ORAL_TABLET | ORAL | Status: DC

## 2014-09-02 NOTE — Progress Notes (Signed)
   Subjective:    Patient ID: Chloe Orr, female    DOB: [birth year], 39 y.o.   MRN: [id number]  Delman Cheadle, PA-C  HPI BURNING PAIN IN LOWER ABD PAIN FOR 7 DAYS AND HAD BLISTERS OF HER STOMACH/LEG AND LEFT ARM. GIVEN MEDS FOR SHINGLES AND TOOK IT FOR 4 DAYS. STOPPED IT BECAUSE HER STOMACH WAS STILL HURTING. BEEN OFF SINCE MON OR TUE. TOOK A LAXATIVE CAUSE SHE THOUGHT SHE WAS BACKED UP BUT NOW SHE HAS LOOSE STOOLS. THIS MORNING SHE THREW UP. NAUSEA: NOT REALLY. VOMITED THIS AM(NO BLOOD). FEELS LIKE HEARTBURN BUT ITS DOWN BELOW. STRESSED OUT. SHE'S HUNGRY BUT HER STOMACH. HAS OXYCODONE & IBUPROFEN FOR PAIN.  PT DENIES FEVER, CHILLS, HEMATOCHEZIA,  nausea, melena, CHEST PAIN, SHORTNESS OF BREATH,  CHANGE IN BOWEL IN HABITS, constipation, problems swallowing, problems with sedation, heartburn or indigestion.  Past Medical History  Diagnosis Date  . Hypercholesteremia    Past Surgical History  Procedure Laterality Date  . Tubal ligation    . Cesarean section  1996, 2010    [facility]  . Mass excision  2011    right arm mass excision  . Mass excision  02/11/2012    Procedure: EXCISION MASS;  Surgeon: Jamesetta So, MD;  Location: [facility];  Service: General;  Laterality: Left;  Excision Neoplasm Left Axilla   No Known Allergies  Current Outpatient Prescriptions  Medication Sig Dispense Refill  . diphenhydrAMINE (BENADRYL) 25 MG tablet Take 1 tablet (25 mg total) by mouth every 6 (six) hours as needed for itching (Rash).    . simvastatin (ZOCOR) 20 MG tablet Take 20 mg by mouth at bedtime.    .      .      .      .       Review of Systems PER HPI OTHERWISE ALL SYSTEMS ARE NEGATIVE. LMP-JAN 2016     Objective:   Physical Exam  Constitutional: She is oriented to person, place, and time. She appears well-developed and well-nourished. No distress.  HENT:  Head: Normocephalic and atraumatic.  Mouth/Throat: Oropharynx is clear and moist. No oropharyngeal exudate.  Eyes:  Pupils are equal, round, and reactive to light. No scleral icterus.  Neck: Normal range of motion. Neck supple.  Cardiovascular: Normal rate, regular rhythm and normal heart sounds.   Pulmonary/Chest: Effort normal and breath sounds normal. No respiratory distress.  Abdominal: Soft. Bowel sounds are normal. She exhibits no distension. There is no tenderness.  Musculoskeletal: She exhibits no edema.  Lymphadenopathy:    She has no cervical adenopathy.  Neurological: She is alert and oriented to person, place, and time.  Skin: Rash noted.  FREQUENT ERUPTIONS W/O ERYTHEMA ON LEFT LOWER QUADRANT  Psychiatric: She has a normal mood and affect.  Vitals reviewed.         Assessment & Plan:

## 2014-09-02 NOTE — Patient Instructions (Signed)
TAKE DEXILANT ONCE A DAY FOR 2 WEEKS.  TAKE IBUPROFEN EVERY 6 HOURS FOR 5 DAYS WITH FOOD OR CRACKERS.  TAKE NEURONTIN 100 MG THREE TIMES DAY FOR 7 DAYS. IT MAY CAUSE DROWSINESS.  COMPLETE VALTREX.  USE PHENERGAN AS NEEDED FOR NAUSEA OR VOMITING.  PLEASE CALL WITH QUESTIONS OR CONCERNS.

## 2014-09-02 NOTE — Assessment & Plan Note (Addendum)
SX IMPROVING BUT PAIN NOT CONTROLLED.   TAKE DEXILANT ONCE A DAY FOR 2 WEEKS. TAKE IBUPROFEN EVERY 6 HOURS FOR 5 DAYS WITH FOOD OR CRACKERS. TAKE NEURONTIN 100 MG THREE TIMES DAY FOR 7 DAYS. IT MAY CAUSE DROWSINESS. COMPLETE VALTREX. USE PHENERGAN AS NEEDED FOR NAUSEA OR VOMITING. CALL WITH QUESTIONS OR CONCERNS.

## 2014-09-11 NOTE — Progress Notes (Signed)
CC'ED TO PCP 

## 2015-07-20 ENCOUNTER — Emergency Department (HOSPITAL_COMMUNITY)
Admission: EM | Admit: 2015-07-20 | Discharge: 2015-07-20 | Disposition: A | Discharge status: Home / Self Care | Payer: BLUE CROSS/BLUE SHIELD | Attending: Emergency Medicine | Admitting: Emergency Medicine

## 2015-07-20 ENCOUNTER — Encounter (HOSPITAL_COMMUNITY): Payer: Self-pay | Admitting: Emergency Medicine

## 2015-07-20 DIAGNOSIS — S40861A Insect bite (nonvenomous) of right upper arm, initial encounter: Secondary | ICD-10-CM | POA: Insufficient documentation

## 2015-07-20 DIAGNOSIS — Y998 Other external cause status: Secondary | ICD-10-CM | POA: Insufficient documentation

## 2015-07-20 DIAGNOSIS — Y9289 Other specified places as the place of occurrence of the external cause: Secondary | ICD-10-CM | POA: Diagnosis not present

## 2015-07-20 DIAGNOSIS — S50861A Insect bite (nonvenomous) of right forearm, initial encounter: Secondary | ICD-10-CM | POA: Insufficient documentation

## 2015-07-20 DIAGNOSIS — E7800 Pure hypercholesterolemia, unspecified: Secondary | ICD-10-CM | POA: Insufficient documentation

## 2015-07-20 DIAGNOSIS — W57XXXA Bitten or stung by nonvenomous insect and other nonvenomous arthropods, initial encounter: Secondary | ICD-10-CM | POA: Diagnosis not present

## 2015-07-20 DIAGNOSIS — Y9389 Activity, other specified: Secondary | ICD-10-CM | POA: Diagnosis not present

## 2015-07-20 MED ORDER — SULFAMETHOXAZOLE-TRIMETHOPRIM 800-160 MG PO TABS
1.0000 | ORAL_TABLET | Freq: Once | ORAL | Status: AC
  Administered 2015-07-20: 1 via ORAL
  Filled 2015-07-20 (×2): qty 1

## 2015-07-20 MED ORDER — SULFAMETHOXAZOLE-TRIMETHOPRIM 800-160 MG PO TABS: 1.0000 | ORAL_TABLET | Freq: Two times a day (BID) | ORAL | Status: AC

## 2015-07-20 MED ORDER — DIPHENHYDRAMINE HCL 50 MG/ML IJ SOLN
25.0000 mg | Freq: Once | INTRAMUSCULAR | Status: AC
  Administered 2015-07-20: 25 mg via INTRAMUSCULAR
  Filled 2015-07-20: qty 1

## 2015-07-20 NOTE — ED Notes (Signed)
Patient complaining of swelling and redness noted to right forearm since yesterday. Area is warm to touch. Also has redness noted to upper right arm and underneath right arm.

## 2015-07-20 NOTE — Discharge Instructions (Signed)
Cellulitis Cellulitis is an infection of the skin and the tissue beneath it. The infected area is usually red and tender. Cellulitis occurs most often in the arms and lower legs.  CAUSES  Cellulitis is caused by bacteria that enter the skin through cracks or cuts in the skin. The most common types of bacteria that cause cellulitis are staphylococci and streptococci. SIGNS AND SYMPTOMS   Redness and warmth.  Swelling.  Tenderness or pain.  Fever. DIAGNOSIS  Your health care provider can usually determine what is wrong based on a physical exam. Blood tests may also be done. TREATMENT  Treatment usually involves taking an antibiotic medicine. HOME CARE INSTRUCTIONS   Take your antibiotic medicine as directed by your health care provider. Finish the antibiotic even if you start to feel better.  Keep the infected arm or leg elevated to reduce swelling.  Apply a warm cloth to the affected area up to 4 times per day to relieve pain.  Take medicines only as directed by your health care provider.  Keep all follow-up visits as directed by your health care provider. SEEK MEDICAL CARE IF:   You notice red streaks coming from the infected area.  Your red area gets larger or turns dark in color.  Your bone or joint underneath the infected area becomes painful after the skin has healed.  Your infection returns in the same area or another area.  You notice a swollen bump in the infected area.  You develop new symptoms.  You have a fever. SEEK IMMEDIATE MEDICAL CARE IF:   You feel very sleepy.  You develop vomiting or diarrhea.  You have a general ill feeling (malaise) with muscle aches and pains.   This information is not intended to replace advice given to you by your health care provider. Make sure you discuss any questions you have with your health care provider.   Document Released: 04/18/2005 Document Revised: 03/30/2015 Document Reviewed: 09/24/2011 Elsevier Interactive  Patient Education 2016 [place] entire course of the antibiotics prescribed, taking your next dose tomorrow morning.  Applying ice or cool compresses can also help with itching.  Try to avoid rubbing the site as this can make it worse.  I suspect you may have been bit by bed bugs, see the information below about this insect.  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

## 2015-07-21 NOTE — ED Provider Notes (Signed)
CSN: [id number]     Arrival date & time 07/20/15  1812 History   First MD Initiated Contact with Patient 07/20/15 1957     Chief Complaint  Patient presents with  . Abscess     (Consider location/radiation/quality/duration/timing/severity/associated sxs/prior Treatment) The history is provided by the patient.   Chloe Orr is a 39 y.o. female with no significant past medical history presenting with 2 distinct suspected insect bites on her right forearm and upper arm.  She noticed these lesions yesterday which were initially itchy, but now has developed worsened pain and expanding surrounding redness.  She denies seeing the suspected culprit insect.  She denies fevers, chills, nausea, abdominal pain.  She has taken benadryl for the itching with transient improvement in the itching.     Past Medical History  Diagnosis Date  . Hypercholesteremia    Past Surgical History  Procedure Laterality Date  . Tubal ligation    . Cesarean section  1996, 2010    [facility]  . Mass excision  2011    right arm mass excision  . Mass excision  02/11/2012    Procedure: EXCISION MASS;  Surgeon: Jamesetta So, MD;  Location: [facility];  Service: General;  Laterality: Left;  Excision Neoplasm Left Axilla   History reviewed. No pertinent family history. Social History  Substance Use Topics  . Smoking status: Never Smoker   . Smokeless tobacco: None  . Alcohol Use: Yes     Comment: 1/2 beer daily   OB History    No data available     Review of Systems  Constitutional: Negative for fever and chills.  Respiratory: Negative for shortness of breath and wheezing.   Skin: Positive for color change and wound.  Neurological: Negative for numbness.      Allergies  Review of patient's allergies indicates no known allergies.  Home Medications   Prior to Admission medications   Medication Sig Start Date End Date Taking? Authorizing Provider  simvastatin (ZOCOR) 20 MG tablet Take 20 mg by  mouth at bedtime.   Yes Historical Provider, MD  diphenhydrAMINE (BENADRYL) 25 MG tablet Take 1 tablet (25 mg total) by mouth every 6 (six) hours as needed for itching (Rash). Patient not taking: Reported on 07/20/2015 01/18/14   Jarrett Soho Muthersbaugh, PA-C  diphenhydrAMINE (SOMINEX) 25 MG tablet Take 25 mg by mouth at bedtime as needed for itching. For infection in eye    Historical Provider, MD  gabapentin (NEURONTIN) 100 MG capsule 1 po tid for 7 days Patient not taking: Reported on 07/20/2015 09/02/14   Danie Binder, MD  promethazine (PHENERGAN) 12.5 MG tablet 1-2 po q4-6 h prn nausea or vomiting Patient not taking: Reported on 07/20/2015 09/02/14   Danie Binder, MD  sulfamethoxazole-trimethoprim (BACTRIM DS,SEPTRA DS) 800-160 MG tablet Take 1 tablet by mouth 2 (two) times daily. 07/20/15 07/27/15  Evalee Jefferson, PA-C   BP 138/90 mmHg  Pulse 82  Temp(Src) 98.6 F (37 C) (Oral)  Resp 18  Ht 4\' 11"  (1.499 m)  Wt 70.308 kg  BMI 31.29 kg/m2  SpO2 100%  LMP 07/20/2015 Physical Exam  Constitutional: She appears well-developed and well-nourished. No distress.  HENT:  Head: Normocephalic.  Neck: Neck supple.  Cardiovascular: Normal rate.   Pulmonary/Chest: Effort normal. She has no wheezes.  Musculoskeletal: Normal range of motion. She exhibits no edema.  Skin: There is erythema.  2 raised indurated papules approx 0.5 cm without drainage, no pustule, vesicle or punctum, one on  right forearm the other on medial upper arm.  3 cm surrounding erythema.  No red streaking.  No axillary adenopathy or pain. Skin intact. Blanching. No necrosis or central ulceration.    ED Course  Procedures (including critical care time) Labs Review Labs Reviewed - No data to display  Imaging Review No results found. I have personally reviewed and evaluated these images and lab results as part of my medical decision-making.   EKG Interpretation None      MDM   Final diagnoses:  Infected insect bite     Suspect possible inflammatory reaction to insect bites, however, possible early cellulitis.  Pt was covered with bactrim, advised continued benadryl prn, warm compresses.  F/u with pcp for recheck if not improved with tx.    Evalee Jefferson, PA-C 07/21/15 1343  Varney Biles, MD 07/21/15 [phone]

## 2015-10-05 ENCOUNTER — Encounter: Payer: Self-pay | Admitting: Obstetrics & Gynecology

## 2015-10-05 ENCOUNTER — Ambulatory Visit (INDEPENDENT_AMBULATORY_CARE_PROVIDER_SITE_OTHER): Payer: BLUE CROSS/BLUE SHIELD | Admitting: Obstetrics and Gynecology

## 2015-10-05 ENCOUNTER — Encounter: Payer: Self-pay | Admitting: Obstetrics and Gynecology

## 2015-10-05 VITALS — BP 120/80 | Ht 59.0 in | Wt 172.5 lb

## 2015-10-05 DIAGNOSIS — D251 Intramural leiomyoma of uterus: Secondary | ICD-10-CM | POA: Diagnosis not present

## 2015-10-05 DIAGNOSIS — D259 Leiomyoma of uterus, unspecified: Secondary | ICD-10-CM | POA: Insufficient documentation

## 2015-10-05 DIAGNOSIS — N921 Excessive and frequent menstruation with irregular cycle: Secondary | ICD-10-CM

## 2015-10-05 NOTE — Progress Notes (Signed)
Patient ID: Chloe Orr, female   DOB: [birth year], 40 y.o.   MRN: [id number] Pt here today for heavy bleeding with her periods. Pt states that she has a history of fibroids and thinks that they may be back.

## 2015-10-05 NOTE — Progress Notes (Signed)
Patient ID: Chloe Orr, female   DOB: [birth year], 40 y.o.   MRN: [id number]   [facility] Visit  Patient name: Chloe Orr MRN [id number]  Date of birth: 05/31/1976  CC & HPI:  Chloe Orr is a 40 y.o. female s/p tubal ligation presenting today for menorrhagia. Pt reports her periods are heavy with clotting for 5 days followed by a week of no bleeding and 5 days of spotting. She states her iron was low at a recent doctors visit and was prescribed 5 days of megace with moderate relief of heavy bleeding. She reports that she is currently spotting after taking the megace. Pt states she believes she was told her fibroids were reduced during cesarean section 5 years ago. She denies weight loss or gain, abdominal masses. She is sexually active with one partner. Pt notes she is UTD on pap smears.   ROS:  A complete 10 system review of systems was obtained and all systems are negative except as noted in the HPI and PMH.    Pertinent History Reviewed:   Reviewed: Significant for hypercholesterolemia, tubal ligations, cesarean section, uterine fibroids   Medical         Past Medical History  Diagnosis Date  . Hypercholesteremia                               Surgical Hx:    Past Surgical History  Procedure Laterality Date  . Tubal ligation    . Cesarean section  1996, 2010    [facility]  . Mass excision  2011    right arm mass excision  . Mass excision  02/11/2012    Procedure: EXCISION MASS;  Surgeon: Jamesetta So, MD;  Location: [facility];  Service: General;  Laterality: Left;  Excision Neoplasm Left Axilla   Medications: Reviewed & Updated - see associated section                       Current outpatient prescriptions:  .  diphenhydrAMINE (BENADRYL) 25 MG tablet, Take 1 tablet (25 mg total) by mouth every 6 (six) hours as needed for itching (Rash)., Disp: 30 tablet, Rfl: 0 .  Fish Oil-Cholecalciferol (FISH OIL + D3 PO), Take 1 capsule by mouth at bedtime., Disp: ,  Rfl:  .  niacin 500 MG tablet, Take 500 mg by mouth at bedtime., Disp: , Rfl:  .  simvastatin (ZOCOR) 20 MG tablet, Take 20 mg by mouth at bedtime., Disp: , Rfl:  .  megestrol (MEGACE) 40 MG/ML suspension, Reported on 10/05/2015, Disp: , Rfl: 0   Social History: Reviewed -  reports that she has never smoked. She has never used smokeless tobacco.  Objective Findings:  Vitals: Blood pressure 120/80, height 4\' 11"  (1.499 m), weight 172 lb 8 oz (78.245 kg), last menstrual period 09/21/2015.  GENERAL: Well-developed, well-nourished female in no acute distress.  HEENT: Normocephalic, atraumatic. Sclerae anicteric.  NECK: Supple. Normal thyroid.  LUNGS: Clear to auscultation bilaterally.  HEART: Regular rate and rhythm. ABDOMEN: Soft, nontender, nondistended. No organomegaly. PELVIC: Normal external female genitalia. Vagina is pink and rugated.  Normal discharge. Normal cervix contour. No adnexal mass or tenderness. Normal vaginal tissues. Normal appearing cervix with good support. Uterus difficult to palpate due to abdominal thickness. Feels slightly enlarged on exam.  EXTREMITIES: No cyanosis, clubbing, or edema, 2+ distal pulses.   Assessment & Plan:  A:  1. Menorrhagia with anemia.- 5 days of bleeding followed by 7 days of no bleeding and 5 days of spotting  2. Moderate relief with Megace rx by PCP 3. Uterus difficult to palpate on exam due to abdominal thickness, but feels slightly enlarged   P:  1. Schedule pelvic/transvaginal US  2. Follow up at time of u/s. To discuss txoptions   By signing my name below, I, Hansel Feinstein, attest that this documentation has been prepared under the direction and in the presence of [name], MD. Electronically Signed: Hansel Feinstein, ED Scribe. 10/05/2015. 3:44 PM.  I personally performed the services described in this documentation, which was SCRIBED in my presence. The recorded information has been reviewed and considered accurate. It has been  edited as necessary during review. [name], MD

## 2015-10-06 ENCOUNTER — Other Ambulatory Visit: Payer: Self-pay | Admitting: Obstetrics and Gynecology

## 2015-10-06 DIAGNOSIS — N920 Excessive and frequent menstruation with regular cycle: Secondary | ICD-10-CM

## 2015-10-06 DIAGNOSIS — N921 Excessive and frequent menstruation with irregular cycle: Secondary | ICD-10-CM

## 2015-10-11 ENCOUNTER — Ambulatory Visit (INDEPENDENT_AMBULATORY_CARE_PROVIDER_SITE_OTHER): Payer: BLUE CROSS/BLUE SHIELD

## 2015-10-11 ENCOUNTER — Encounter: Payer: Self-pay | Admitting: Obstetrics and Gynecology

## 2015-10-11 ENCOUNTER — Ambulatory Visit (INDEPENDENT_AMBULATORY_CARE_PROVIDER_SITE_OTHER): Payer: BLUE CROSS/BLUE SHIELD | Admitting: Obstetrics and Gynecology

## 2015-10-11 VITALS — BP 120/70 | Ht 59.0 in

## 2015-10-11 DIAGNOSIS — N921 Excessive and frequent menstruation with irregular cycle: Secondary | ICD-10-CM

## 2015-10-11 DIAGNOSIS — D250 Submucous leiomyoma of uterus: Secondary | ICD-10-CM | POA: Diagnosis not present

## 2015-10-11 MED ORDER — NORGESTIMATE-ETH ESTRADIOL 0.25-35 MG-MCG PO TABS: 1.0000 | ORAL_TABLET | Freq: Every day | ORAL | Status: DC

## 2015-10-11 NOTE — Progress Notes (Signed)
PELVIC US TA/TV: enlarged heterogenous uterus w/mult subserosal fibroids,(#1) ant fundal 5.1 x 4.8 x 4.6cm,(#2) fundal distorting endometrium 5.6 x 3.3 x 4.8 cm,(#3) left mid uterus 4.3 x 5 x 3.7 cm, EEC 8.1 mm,limited view of lt ov,but appears to be wnl, enlarged heterogenous rt ovary containing mult cystic areas  8.8 x 5.5 x 7.9 cm,rt ov also has a echogenic mass w/shadowing 6.7 x 4.7 x 5.7 cm (?dermoid)( unable to reproduce echogenic mass  on TV).

## 2015-10-11 NOTE — Progress Notes (Signed)
Patient ID: LENIAH WILLETTE, female   DOB: Aug 18, 1975, 40 y.o.   MRN: [id number]   [facility] Visit  Patient name: RUBELL CANTARELLA MRN [id number]  Date of birth: November 12, 1975  CC & HPI:  DEMERIA LADINO is a 40 y.o. female presenting today for followup for u/s and management of heavy menses. U/S = multiple fibroids , including submucous, affecting endometrial contours.and the bleeding. Endometrium thin/normal  ROS:  Pt without significant pain at present. Wishes to try nonsurgical tx.  Discussed IUD, not a good fit. OCP = will try  Pertinent History Reviewed:   Reviewed: Significant for no hx Dvt, or clotting issues Medical         Past Medical History  Diagnosis Date  . Hypercholesteremia                               Surgical Hx:    Past Surgical History  Procedure Laterality Date  . Tubal ligation    . Cesarean section  1996, 2010    [facility]  . Mass excision  2011    right arm mass excision  . Mass excision  02/11/2012    Procedure: EXCISION MASS;  Surgeon: Jamesetta So, MD;  Location: [facility];  Service: General;  Laterality: Left;  Excision Neoplasm Left Axilla   Medications: Reviewed & Updated - see associated section                       Current outpatient prescriptions:  .  diphenhydrAMINE (BENADRYL) 25 MG tablet, Take 1 tablet (25 mg total) by mouth every 6 (six) hours as needed for itching (Rash)., Disp: 30 tablet, Rfl: 0 .  Fish Oil-Cholecalciferol (FISH OIL + D3 PO), Take 1 capsule by mouth at bedtime., Disp: , Rfl:  .  megestrol (MEGACE) 40 MG/ML suspension, Reported on 10/05/2015, Disp: , Rfl: 0 .  niacin 500 MG tablet, Take 500 mg by mouth at bedtime., Disp: , Rfl:  .  norgestimate-ethinyl estradiol (ORTHO-CYCLEN,SPRINTEC,PREVIFEM) 0.25-35 MG-MCG tablet, Take 1 tablet by mouth daily., Disp: 1 Package, Rfl: 11 .  simvastatin (ZOCOR) 20 MG tablet, Take 20 mg by mouth at bedtime., Disp: , Rfl:    Social History: Reviewed -  reports that she has  never smoked. She has never used smokeless tobacco.  Objective Findings:  Vitals: Blood pressure 120/70, height 4\' 11"  (1.499 m), last menstrual period 09/21/2015.  Physical Examination: General appearance - alert, well appearing, and in no distress and oriented to person, place, and time Mental status - alert, oriented to person, place, and time, normal mood, behavior, speech, dress, motor activity, and thought processes Eyes - pupils equal and reactive, extraocular eye movements intact see u.s report  Assessment & Plan:   A:  1. Submucosal fibroids 2. menorhagia with anemia 3 right ovarian cyst  P:  1. Suppress with sprintec. 2. Reassess right ovary again in 3 months.

## 2015-12-12 ENCOUNTER — Ambulatory Visit: Payer: BLUE CROSS/BLUE SHIELD | Admitting: Obstetrics and Gynecology

## 2015-12-16 ENCOUNTER — Ambulatory Visit: Payer: BLUE CROSS/BLUE SHIELD | Admitting: Obstetrics and Gynecology

## 2015-12-21 ENCOUNTER — Ambulatory Visit: Payer: BLUE CROSS/BLUE SHIELD | Admitting: Obstetrics and Gynecology

## 2016-01-05 ENCOUNTER — Ambulatory Visit: Payer: BLUE CROSS/BLUE SHIELD | Admitting: Obstetrics and Gynecology

## 2016-01-05 ENCOUNTER — Encounter: Payer: Self-pay | Admitting: Obstetrics and Gynecology

## 2016-02-03 ENCOUNTER — Encounter (HOSPITAL_COMMUNITY): Payer: Self-pay | Admitting: *Deleted

## 2016-02-03 ENCOUNTER — Inpatient Hospital Stay (HOSPITAL_COMMUNITY)
Admission: AD | Admit: 2016-02-03 | Discharge: 2016-02-03 | Disposition: A | Discharge status: Home / Self Care | Payer: BLUE CROSS/BLUE SHIELD | Source: Ambulatory Visit | Attending: Obstetrics & Gynecology | Admitting: Obstetrics & Gynecology

## 2016-02-03 DIAGNOSIS — E7800 Pure hypercholesterolemia, unspecified: Secondary | ICD-10-CM | POA: Insufficient documentation

## 2016-02-03 DIAGNOSIS — N920 Excessive and frequent menstruation with regular cycle: Secondary | ICD-10-CM | POA: Diagnosis not present

## 2016-02-03 DIAGNOSIS — D250 Submucous leiomyoma of uterus: Secondary | ICD-10-CM

## 2016-02-03 HISTORY — DX: Leiomyoma of uterus, unspecified: D25.9

## 2016-02-03 LAB — CBC
HCT: 37.2 % (ref 36.0–46.0)
Hemoglobin: 12.3 g/dL (ref 12.0–15.0)
MCH: 29.5 pg (ref 26.0–34.0)
MCHC: 33.1 g/dL (ref 30.0–36.0)
MCV: 89.2 fL (ref 78.0–100.0)
Platelets: 247 10*3/uL (ref 150–400)
RBC: 4.17 MIL/uL (ref 3.87–5.11)
RDW: 15.4 % (ref 11.5–15.5)
WBC: 10.9 10*3/uL — ABNORMAL HIGH (ref 4.0–10.5)

## 2016-02-03 LAB — URINALYSIS, ROUTINE W REFLEX MICROSCOPIC
Bilirubin Urine: NEGATIVE
Glucose, UA: NEGATIVE mg/dL
Ketones, ur: NEGATIVE mg/dL
Leukocytes, UA: NEGATIVE
Nitrite: NEGATIVE
Protein, ur: NEGATIVE mg/dL
Specific Gravity, Urine: 1.02 (ref 1.005–1.030)
pH: 5.5 (ref 5.0–8.0)

## 2016-02-03 LAB — URINE MICROSCOPIC-ADD ON: Bacteria, UA: NONE SEEN

## 2016-02-03 LAB — POCT PREGNANCY, URINE: Preg Test, Ur: NEGATIVE

## 2016-02-03 MED ORDER — MEGESTROL ACETATE 40 MG PO TABS: 80.0000 mg | ORAL_TABLET | Freq: Three times a day (TID) | ORAL | Status: DC

## 2016-02-03 MED ORDER — MEGESTROL ACETATE 40 MG PO TABS
80.0000 mg | ORAL_TABLET | Freq: Once | ORAL | Status: AC
  Administered 2016-02-03: 80 mg via ORAL
  Filled 2016-02-03: qty 2

## 2016-02-03 NOTE — MAU Provider Note (Signed)
History     CSN: [id number]  Arrival date and time: 02/03/16 1624   None     Chief Complaint  Patient presents with  . Vaginal Bleeding  . Abdominal Pain   HPI Comments: Non-pregnant female c/o cramping and heavy VB. She reports menses started yesterday and was normal in flow but became heavy this am. She had 2 episodes of a large gush of blood today. She did not take anything for the cramping. She denies dizziness or feeling faint. She was seen 4 mos ago for menorrhagia and had pelvic US which revealed multiple submucosal fibroids. She was prescribed Sprintec and has been taking it as directed.   Pertinent Gynecological History: Menses: 02/02/16 Bleeding: dysfunctional uterine bleeding Contraception: tubal ligation Blood transfusions: none   Past Medical History  Diagnosis Date  . Hypercholesteremia   . Fibroid uterus     Past Surgical History  Procedure Laterality Date  . Tubal ligation    . Cesarean section  1996, 2010    [facility]  . Mass excision  2011    right arm mass excision  . Mass excision  02/11/2012    Procedure: EXCISION MASS;  Surgeon: Jamesetta So, MD;  Location: [facility];  Service: General;  Laterality: Left;  Excision Neoplasm Left Axilla    Family History  Problem Relation Age of Onset  . Diabetes Father   . Birth defects Sister     cleft lip     Social History  Substance Use Topics  . Smoking status: Never Smoker   . Smokeless tobacco: Never Used  . Alcohol Use: Yes     Comment: 1/2 beer daily    Allergies: No Known Allergies  Prescriptions prior to admission  Medication Sig Dispense Refill Last Dose  . Fish Oil-Cholecalciferol (FISH OIL + D3 PO) Take 1 capsule by mouth at bedtime.   02/02/2016 at Unknown time  . niacin 500 MG tablet Take 500 mg by mouth at bedtime.   Past Week at Unknown time  . norgestimate-ethinyl estradiol (ORTHO-CYCLEN,SPRINTEC,PREVIFEM) 0.25-35 MG-MCG tablet Take 1 tablet by mouth daily. 1 Package 11 02/02/2016 at  Unknown time  . simvastatin (ZOCOR) 20 MG tablet Take 20 mg by mouth at bedtime.   02/02/2016 at Unknown time    Review of Systems  Constitutional: Negative.   Gastrointestinal: Positive for abdominal pain.  Neurological: Negative for dizziness and weakness.   Physical Exam   Blood pressure 130/88, pulse 85, temperature 98.2 F (36.8 C), temperature source Oral, resp. rate 18, last menstrual period 02/02/2016.  Physical Exam  Constitutional: She is oriented to person, place, and time. She appears well-developed and well-nourished.  HENT:  Head: Normocephalic and atraumatic.  Neck: Normal range of motion. Neck supple.  Cardiovascular: Normal rate.   Respiratory: Effort normal.  GI: Soft. She exhibits no distension and no mass (fibroid uterus). There is no tenderness.  Genitourinary:  External: No lesions Vagina: rugated, parous, copious bloody discharge with multiple small clots, active bleeding from os; approx 100 mL cleared from vault Uterus: enlarged to 16 wk size, non-tender Adnexae: no masses, no tenderness left, no tenderness right   Musculoskeletal: Normal range of motion.  Neurological: She is alert and oriented to person, place, and time.  Skin: Skin is warm and dry.  Psychiatric: She has a normal mood and affect.    MAU Course  Procedures Results for orders placed or performed during the hospital encounter of 02/03/16 (from the past 24 hour(s))  Urinalysis, Routine w  reflex microscopic (not at [facility])     Status: Abnormal   Collection Time: 02/03/16  4:40 PM  Result Value Ref Range   Color, Urine YELLOW YELLOW   APPearance CLEAR CLEAR   Specific Gravity, Urine 1.020 1.005 - 1.030   pH 5.5 5.0 - 8.0   Glucose, UA NEGATIVE NEGATIVE mg/dL   Hgb urine dipstick LARGE (A) NEGATIVE   Bilirubin Urine NEGATIVE NEGATIVE   Ketones, ur NEGATIVE NEGATIVE mg/dL   Protein, ur NEGATIVE NEGATIVE mg/dL   Nitrite NEGATIVE NEGATIVE   Leukocytes, UA NEGATIVE NEGATIVE  Urine  microscopic-add on     Status: Abnormal   Collection Time: 02/03/16  4:40 PM  Result Value Ref Range   Squamous Epithelial / LPF 0-5 (A) NONE SEEN   WBC, UA 0-5 0 - 5 WBC/hpf   RBC / HPF 0-5 0 - 5 RBC/hpf   Bacteria, UA NONE SEEN NONE SEEN  Pregnancy, urine POC     Status: None   Collection Time: 02/03/16  4:58 PM  Result Value Ref Range   Preg Test, Ur NEGATIVE NEGATIVE  CBC     Status: Abnormal   Collection Time: 02/03/16  7:01 PM  Result Value Ref Range   WBC 10.9 (H) 4.0 - 10.5 K/uL   RBC 4.17 3.87 - 5.11 MIL/uL   Hemoglobin 12.3 12.0 - 15.0 g/dL   HCT 37.2 36.0 - 46.0 %   MCV 89.2 78.0 - 100.0 fL   MCH 29.5 26.0 - 34.0 pg   MCHC 33.1 30.0 - 36.0 g/dL   RDW 15.4 11.5 - 15.5 %   Platelets 247 150 - 400 K/uL    MDM Consult with Dr. Ihor Dow, recommend Megace. Hemodynamically stable. No evidence of anemia. Stable for discharge home.  Assessment and Plan   1. Menorrhagia with regular cycle   2. Submucous leiomyoma of uterus    Discharge home Megace 80 mg TID on day 1, 80 mg BID on days 2-4, then 40 mg daily  Aleve 1 q12 hrs prn pain Follow up in 1 week with Dr. Glo Herring Return for worsening sx  Julianne Handler, CNM 02/03/2016, 6:58 PM

## 2016-02-03 NOTE — Discharge Instructions (Signed)
Take Aleve For Pain as instructed on bottle

## 2016-02-03 NOTE — MAU Note (Signed)
Thinks it is her fibroid tumors.  Is on cycle and having serious pain.  Had some major pains since checked in, had a big gush and passed a clot. Less pain now

## 2016-02-17 ENCOUNTER — Ambulatory Visit (INDEPENDENT_AMBULATORY_CARE_PROVIDER_SITE_OTHER): Payer: BLUE CROSS/BLUE SHIELD | Admitting: Obstetrics and Gynecology

## 2016-02-17 ENCOUNTER — Encounter: Payer: Self-pay | Admitting: Obstetrics and Gynecology

## 2016-02-17 VITALS — BP 120/78 | Ht 59.0 in | Wt 174.0 lb

## 2016-02-17 DIAGNOSIS — D250 Submucous leiomyoma of uterus: Secondary | ICD-10-CM | POA: Diagnosis not present

## 2016-02-17 DIAGNOSIS — N921 Excessive and frequent menstruation with irregular cycle: Secondary | ICD-10-CM

## 2016-02-17 NOTE — Progress Notes (Signed)
Chloe Orr Visit  02/17/16           Patient name: Chloe Orr MRN [id number]  Date of birth: 1976-06-02  CC & HPI:  Chloe Orr is a 40 y.o. female presenting today for follow up of menorrhagia with anemia. During her last visit she was placed on Sprintec but notes she still experiences intense abdominal pain when she has a period. Patient was seen at [facility] ED for similar complaints; states her pain resolves after passing a blood clot  Pt wants to avoid surgery specifically hysterectomy.She is willing to consider endometrial ablation or hysteroscopy and hysteroscopic resection of myoma  ROS:  ROS Otherwise negative   Pertinent History Reviewed:   Reviewed: Significant for fibroid uterus Medical         Past Medical History:  Diagnosis Date  . Fibroid uterus   . Hypercholesteremia                               Surgical Hx:    Past Surgical History:  Procedure Laterality Date  . [place], 2010   [facility]  . MASS EXCISION  2011   right arm mass excision  . MASS EXCISION  02/11/2012   Procedure: EXCISION MASS;  Surgeon: Jamesetta So, MD;  Location: [facility];  Service: General;  Laterality: Left;  Excision Neoplasm Left Axilla  . TUBAL LIGATION     Medications: Reviewed & Updated - see associated section                       Current Outpatient Prescriptions:  .  Fish Oil-Cholecalciferol (FISH OIL + D3 PO), Take 1 capsule by mouth at bedtime., Disp: , Rfl:  .  megestrol (MEGACE) 40 MG tablet, Take 2 tablets (80 mg total) by mouth 3 (three) times daily., Disp: 30 tablet, Rfl: 0 .  niacin 500 MG tablet, Take 500 mg by mouth at bedtime., Disp: , Rfl:  .  simvastatin (ZOCOR) 20 MG tablet, Take 20 mg by mouth at bedtime., Disp: , Rfl:    Social History: Reviewed -  reports that she has never smoked. She has never used smokeless tobacco.  Objective Findings:  Vitals: Blood pressure 120/78, height 4\' 11"  (1.499 m), weight 174 lb (78.9 kg),  last menstrual period 02/02/2016.  Physical Examination: General appearance - alert, well appearing, and in no distress and oriented to person, place, and time Mental status - alert, oriented to person, place, and time, normal mood, behavior, speech, dress, motor activity, and thought processes Pelvic -  examination not indicated   Assessment & Plan:   A:  1. Menorrhagia due to submucosal fibroids   P:  1. Continuous active pill regimen   2. Follow up in 6 months 3. Patient will be a candidate for hysteroscopic resection of fibroid. She's been informed of this and will follow up if the bleeding continues to be a problem while on continuous active pill regimen   By signing my name below, I, Chloe Orr, attest that this documentation has been prepared under the direction and in the presence of [name], MD . Electronically Signed: Evelene Orr, Scribe. 02/17/2016. 12:53 PM. I personally performed the services described in this documentation, which was SCRIBED in my presence. The recorded information has been reviewed and considered accurate. It has been edited as necessary during review. [name], MD

## 2016-02-27 ENCOUNTER — Telehealth: Payer: Self-pay | Admitting: *Deleted

## 2016-02-27 MED ORDER — NORGESTIMATE-ETH ESTRADIOL 0.25-35 MG-MCG PO TABS: 1.0000 | ORAL_TABLET | Freq: Every day | ORAL | 11 refills | Status: DC

## 2016-02-27 NOTE — Telephone Encounter (Signed)
Pt states saw Dr. Glo Herring last Friday and was told to start taking her birth control pill (Sprintec) on a continuous active pill regimen.  Pt states since she is not taking the "dumpy pills" she needs a RX sent to [facility].   She will have no more BCP after today.

## 2016-02-27 NOTE — Telephone Encounter (Signed)
Refilled sprintec x 1 yr

## 2016-04-25 DIAGNOSIS — E782 Mixed hyperlipidemia: Secondary | ICD-10-CM | POA: Diagnosis not present

## 2016-04-27 DIAGNOSIS — E6609 Other obesity due to excess calories: Secondary | ICD-10-CM | POA: Diagnosis not present

## 2016-04-27 DIAGNOSIS — Z6836 Body mass index (BMI) 36.0-36.9, adult: Secondary | ICD-10-CM | POA: Diagnosis not present

## 2016-04-27 DIAGNOSIS — Z1389 Encounter for screening for other disorder: Secondary | ICD-10-CM | POA: Diagnosis not present

## 2016-04-27 DIAGNOSIS — E782 Mixed hyperlipidemia: Secondary | ICD-10-CM | POA: Diagnosis not present

## 2016-07-06 DIAGNOSIS — N62 Hypertrophy of breast: Secondary | ICD-10-CM | POA: Diagnosis not present

## 2016-07-06 DIAGNOSIS — M25512 Pain in left shoulder: Secondary | ICD-10-CM | POA: Diagnosis not present

## 2016-07-06 DIAGNOSIS — M549 Dorsalgia, unspecified: Secondary | ICD-10-CM | POA: Diagnosis not present

## 2016-07-06 DIAGNOSIS — M542 Cervicalgia: Secondary | ICD-10-CM | POA: Diagnosis not present

## 2016-07-19 ENCOUNTER — Other Ambulatory Visit (HOSPITAL_COMMUNITY): Payer: Self-pay | Admitting: Family Medicine

## 2016-07-19 DIAGNOSIS — Z1231 Encounter for screening mammogram for malignant neoplasm of breast: Secondary | ICD-10-CM

## 2016-07-25 ENCOUNTER — Ambulatory Visit (HOSPITAL_COMMUNITY)
Admission: RE | Admit: 2016-07-25 | Discharge: 2016-07-25 | Disposition: A | Discharge status: Home / Self Care | Payer: BLUE CROSS/BLUE SHIELD | Source: Ambulatory Visit | Attending: Family Medicine | Admitting: Family Medicine

## 2016-07-25 ENCOUNTER — Encounter (HOSPITAL_COMMUNITY): Payer: Self-pay | Admitting: Radiology

## 2016-07-25 DIAGNOSIS — Z1231 Encounter for screening mammogram for malignant neoplasm of breast: Secondary | ICD-10-CM | POA: Diagnosis not present

## 2016-08-31 DIAGNOSIS — Z681 Body mass index (BMI) 19 or less, adult: Secondary | ICD-10-CM | POA: Diagnosis not present

## 2016-08-31 DIAGNOSIS — N390 Urinary tract infection, site not specified: Secondary | ICD-10-CM | POA: Diagnosis not present

## 2016-08-31 DIAGNOSIS — Z1389 Encounter for screening for other disorder: Secondary | ICD-10-CM | POA: Diagnosis not present

## 2016-08-31 DIAGNOSIS — M546 Pain in thoracic spine: Secondary | ICD-10-CM | POA: Diagnosis not present

## 2016-09-14 DIAGNOSIS — N390 Urinary tract infection, site not specified: Secondary | ICD-10-CM | POA: Diagnosis not present

## 2016-09-20 HISTORY — PX: BREAST REDUCTION SURGERY: SHX8

## 2016-10-05 DIAGNOSIS — E860 Dehydration: Secondary | ICD-10-CM | POA: Diagnosis not present

## 2016-10-05 DIAGNOSIS — Z6837 Body mass index (BMI) 37.0-37.9, adult: Secondary | ICD-10-CM | POA: Diagnosis not present

## 2016-10-05 DIAGNOSIS — Z1389 Encounter for screening for other disorder: Secondary | ICD-10-CM | POA: Diagnosis not present

## 2016-10-05 DIAGNOSIS — N62 Hypertrophy of breast: Secondary | ICD-10-CM | POA: Diagnosis not present

## 2016-10-19 DIAGNOSIS — M542 Cervicalgia: Secondary | ICD-10-CM | POA: Diagnosis not present

## 2016-10-19 DIAGNOSIS — E785 Hyperlipidemia, unspecified: Secondary | ICD-10-CM | POA: Diagnosis not present

## 2016-10-19 DIAGNOSIS — M549 Dorsalgia, unspecified: Secondary | ICD-10-CM | POA: Diagnosis not present

## 2016-10-19 DIAGNOSIS — E669 Obesity, unspecified: Secondary | ICD-10-CM | POA: Diagnosis not present

## 2016-10-19 DIAGNOSIS — N62 Hypertrophy of breast: Secondary | ICD-10-CM | POA: Diagnosis not present

## 2016-10-19 DIAGNOSIS — Z6837 Body mass index (BMI) 37.0-37.9, adult: Secondary | ICD-10-CM | POA: Diagnosis not present

## 2016-10-22 DIAGNOSIS — E785 Hyperlipidemia, unspecified: Secondary | ICD-10-CM | POA: Diagnosis not present

## 2016-10-22 DIAGNOSIS — Z6837 Body mass index (BMI) 37.0-37.9, adult: Secondary | ICD-10-CM | POA: Diagnosis not present

## 2016-10-22 DIAGNOSIS — N62 Hypertrophy of breast: Secondary | ICD-10-CM | POA: Diagnosis not present

## 2016-10-22 DIAGNOSIS — M542 Cervicalgia: Secondary | ICD-10-CM | POA: Diagnosis not present

## 2016-10-22 DIAGNOSIS — M549 Dorsalgia, unspecified: Secondary | ICD-10-CM | POA: Diagnosis not present

## 2016-10-22 DIAGNOSIS — E669 Obesity, unspecified: Secondary | ICD-10-CM | POA: Diagnosis not present

## 2016-10-23 DIAGNOSIS — E785 Hyperlipidemia, unspecified: Secondary | ICD-10-CM | POA: Diagnosis not present

## 2016-10-23 DIAGNOSIS — Z6837 Body mass index (BMI) 37.0-37.9, adult: Secondary | ICD-10-CM | POA: Diagnosis not present

## 2016-10-23 DIAGNOSIS — M542 Cervicalgia: Secondary | ICD-10-CM | POA: Diagnosis not present

## 2016-10-23 DIAGNOSIS — E669 Obesity, unspecified: Secondary | ICD-10-CM | POA: Diagnosis not present

## 2016-10-23 DIAGNOSIS — M549 Dorsalgia, unspecified: Secondary | ICD-10-CM | POA: Diagnosis not present

## 2016-10-23 DIAGNOSIS — N62 Hypertrophy of breast: Secondary | ICD-10-CM | POA: Diagnosis not present

## 2016-11-09 DIAGNOSIS — N6489 Other specified disorders of breast: Secondary | ICD-10-CM | POA: Diagnosis not present

## 2016-11-13 DIAGNOSIS — N6489 Other specified disorders of breast: Secondary | ICD-10-CM | POA: Diagnosis not present

## 2016-11-20 DIAGNOSIS — Z4682 Encounter for fitting and adjustment of non-vascular catheter: Secondary | ICD-10-CM | POA: Diagnosis not present

## 2016-11-20 DIAGNOSIS — N62 Hypertrophy of breast: Secondary | ICD-10-CM | POA: Diagnosis not present

## 2016-11-27 DIAGNOSIS — Z79899 Other long term (current) drug therapy: Secondary | ICD-10-CM | POA: Diagnosis not present

## 2016-11-27 DIAGNOSIS — N62 Hypertrophy of breast: Secondary | ICD-10-CM | POA: Diagnosis not present

## 2016-11-27 DIAGNOSIS — Z4803 Encounter for change or removal of drains: Secondary | ICD-10-CM | POA: Diagnosis not present

## 2016-11-27 DIAGNOSIS — I898 Other specified noninfective disorders of lymphatic vessels and lymph nodes: Secondary | ICD-10-CM | POA: Diagnosis not present

## 2016-11-27 DIAGNOSIS — Z793 Long term (current) use of hormonal contraceptives: Secondary | ICD-10-CM | POA: Diagnosis not present

## 2017-03-06 DIAGNOSIS — Z6836 Body mass index (BMI) 36.0-36.9, adult: Secondary | ICD-10-CM | POA: Diagnosis not present

## 2017-03-06 DIAGNOSIS — Z0000 Encounter for general adult medical examination without abnormal findings: Secondary | ICD-10-CM | POA: Diagnosis not present

## 2017-03-06 DIAGNOSIS — Z1389 Encounter for screening for other disorder: Secondary | ICD-10-CM | POA: Diagnosis not present

## 2017-03-07 ENCOUNTER — Other Ambulatory Visit: Payer: Self-pay | Admitting: Obstetrics and Gynecology

## 2017-03-18 DIAGNOSIS — Z1389 Encounter for screening for other disorder: Secondary | ICD-10-CM | POA: Diagnosis not present

## 2017-03-18 DIAGNOSIS — D259 Leiomyoma of uterus, unspecified: Secondary | ICD-10-CM | POA: Diagnosis not present

## 2017-03-18 DIAGNOSIS — N838 Other noninflammatory disorders of ovary, fallopian tube and broad ligament: Secondary | ICD-10-CM | POA: Diagnosis not present

## 2017-03-21 DIAGNOSIS — Z1389 Encounter for screening for other disorder: Secondary | ICD-10-CM | POA: Diagnosis not present

## 2017-04-02 DIAGNOSIS — N62 Hypertrophy of breast: Secondary | ICD-10-CM | POA: Diagnosis not present

## 2017-04-03 ENCOUNTER — Encounter (INDEPENDENT_AMBULATORY_CARE_PROVIDER_SITE_OTHER): Payer: Self-pay

## 2017-04-03 ENCOUNTER — Encounter: Payer: Self-pay | Admitting: Obstetrics and Gynecology

## 2017-04-03 ENCOUNTER — Ambulatory Visit (INDEPENDENT_AMBULATORY_CARE_PROVIDER_SITE_OTHER): Payer: BLUE CROSS/BLUE SHIELD | Admitting: Obstetrics and Gynecology

## 2017-04-03 VITALS — BP 118/70 | HR 83 | Ht 59.0 in | Wt 180.0 lb

## 2017-04-03 DIAGNOSIS — Z01419 Encounter for gynecological examination (general) (routine) without abnormal findings: Secondary | ICD-10-CM

## 2017-04-03 DIAGNOSIS — D250 Submucous leiomyoma of uterus: Secondary | ICD-10-CM | POA: Diagnosis not present

## 2017-04-03 DIAGNOSIS — D251 Intramural leiomyoma of uterus: Secondary | ICD-10-CM

## 2017-04-03 DIAGNOSIS — D279 Benign neoplasm of unspecified ovary: Secondary | ICD-10-CM

## 2017-04-03 DIAGNOSIS — Z01411 Encounter for gynecological examination (general) (routine) with abnormal findings: Secondary | ICD-10-CM | POA: Diagnosis not present

## 2017-04-03 NOTE — Patient Instructions (Signed)
Discussion: Discussed with pt weight loss methods including food measurement and calorie counting using apps such as MyNetDiary or My Fitness Pal. Recommended the use of measuring cups and spoons to monitor serving sizes. Encouraged adequate daily water intake, especially prior to meals to eliminate overeating. Additionally encouraged pt to become more active by taking daily walks of at least 30 minute duration, join a local gym such as the [facility] or attend water aerobics classes. Also advised pt to use pedometer on smartphone or utilize a smartband such as FitBit to keep track of daily activity.   At end of discussion, pt had opportunity to ask questions and has no further questions at this time.   Specific discussion of lifestyle changes, behavioral modifications and healthy eating habits as noted above. Greater than 50% was spent in counseling and coordination of care with the patient.  Total time greater than: 20 minutes.  Weight loss guidelines and tips   1. Utilize measuring cups and spoons (found at a low price at [facility]) to monitor and manage serving sizes. Use them to carefully measure out serving sizes listed on food packaging to prevent overeating and better manage caloric intake. Most of [place] tell ourselves lies about how much a serving really is. For example, a serving of meat is actually 4 oz, which is the size of a deck of cards!!!   2. Utilize smart phone apps like "My Net Diary", "My Fitness Pal", "Lose It", or "Eat Better" to keep track of calorie intake and exercise. Also consider purchasing a pedometer to keep track of daily steps. Some smart phones have built in pedometers to keep track of step counts (your goal is 10,000 steps per day).   3. Make sure you are consuming enough water daily. Drink 8 oz prior to meals, or at snack time, to cut down on overeating.   4. Consider exercise programs. The [employer] offers water aerobics classes that are low-impact to minimize joint pains while  still burning calories. For maximum impact at home, do small exercises while watching TV during commercial breaks.   The Truth Table:   3,500 calories = 1 pound of fat  Minus 500 calories a day x 7 days will lose you 1 pound  Exercise helps, but one mile walk = 200 calories burned   10,000 steps is your goal for the day, that's 4-5 miles!   *Please cut out and put on your refrigerator*

## 2017-04-03 NOTE — Progress Notes (Addendum)
Patient ID: Chloe Orr, female   DOB: 1976-06-08, 41 y.o.   MRN: [id number]   Assessment:  1.  Annual Gyn Exam 2.  Uterine fibroids 18-20 weeks size 3. Status post recent reduction mammoplasty 4. Ovarian dermoid cyst Plan:   3    Annual mammogram advised after age 68 4.   F/u in 6 weeks 5.   Weight loss to be continued during the 6 weeks patient will probably need surgery in November and weight loss preoperatively is strongly encouraged. Counseled over weight loss goals andcalorie rules Subjective:  Chloe Orr is a 41 y.o. female [phone] who presents for annual exam. Patient's last menstrual period was 03/29/2017. Records from her primary care who had an ultrasound performed due to finding a mass in the abdomen had been located and reviewed. The uterus is huge at 16 cm in diameter and some directions, estimated fetal weight greater than 500 gThere is also ovarian dermoid cyst  She is doing well after having fibroids removed. Her body produces excess fluid and had to put drains in through her skin. Pt notes she has been exercising more. Reports a lite period that started Thursday. She had mild cramping with her last period. She would like to keep her cervix.   The following portions of the patient's history were reviewed and updated as appropriate: allergies, current medications, past family history, past medical history, past social history, past surgical history and problem list.   Past Medical History:  Diagnosis Date  . Fibroid uterus   . Hypercholesteremia     Past Surgical History:  Procedure Laterality Date  . [place], 2010   [facility]  . MASS EXCISION  2011   right arm mass excision  . MASS EXCISION  02/11/2012   Procedure: EXCISION MASS;  Surgeon: Jamesetta So, MD;  Location: [facility];  Service: General;  Laterality: Left;  Excision Neoplasm Left Axilla  . TUBAL LIGATION       Current Outpatient Prescriptions:  .  Fish Oil-Cholecalciferol  (FISH OIL + D3 PO), Take 1 capsule by mouth at bedtime., Disp: , Rfl:  .  Lorcaserin HCl (BELVIQ) 10 MG TABS, Take by mouth., Disp: , Rfl:  .  niacin 500 MG tablet, Take 500 mg by mouth at bedtime., Disp: , Rfl:  .  simvastatin (ZOCOR) 20 MG tablet, Take 20 mg by mouth at bedtime., Disp: , Rfl:  .  SPRINTEC 28 0.25-35 MG-MCG tablet, TAKE ONE TABLET BY MOUTH ONCE DAILY, Disp: 28 tablet, Rfl: 11  Review of Systems Constitutional: negative Gastrointestinal: negative Genitourinary: normal  Objective:  BP 118/70 (BP Location: Right Arm, Patient Position: Sitting, Cuff Size: Normal)   Pulse 83   Ht 4\' 11"  (1.499 m)   Wt 180 lb (81.6 kg)   LMP 03/29/2017   BMI 36.36 kg/m    BMI: Body mass index is 36.36 kg/m.  General Appearance: Alert, appropriate appearance for age. No acute distress HEENT: Grossly normal Neck / Thyroid:  Cardiovascular: RRR; normal S1, S2, no murmur Lungs: CTA bilaterally Back: No CVAT Breast Exam: No masses or nodes.No dimpling, nipple retraction or discharge. Gastrointestinal: Soft, non-tender, no masses or organomegaly Pelvic Exam: Exam deferred. uterus is palpable to the umbilici  Rectovaginal: declined by the patient Lymphatic Exam: Non-palpable nodes in neck, clavicular, axillary, or inguinal regions  Skin: no rash or abnormalities Neurologic: Normal gait and speech, no tremor  Psychiatric: Alert and oriented, appropriate affect.  Urinalysis:Not done  Mallory Shirk. MD  Pgr [phone] 1:42 PM `   By signing my name below, I, Margit Banda, attest that this documentation has been prepared under the direction and in the presence of [name], MD. Electronically Signed: Margit Banda, Medical Scribe. 04/03/17. 1:32 PM.  I personally performed the services described in this documentation, which was SCRIBED in my presence. The recorded information has been reviewed and considered accurate. It has been edited as necessary during  review. [name], MD

## 2017-04-15 ENCOUNTER — Telehealth: Payer: Self-pay | Admitting: *Deleted

## 2017-04-15 MED ORDER — TRAMADOL HCL 50 MG PO TABS: 50.0000 mg | ORAL_TABLET | Freq: Four times a day (QID) | ORAL | 0 refills | Status: DC | PRN

## 2017-04-15 NOTE — Telephone Encounter (Signed)
Patient having lots of pain associated with her fibroids, or the dermoid cyst. I will Rx Tramadol briefly, and ask the patient to be seen again.  Sounds like she will need to move forward with surgery.

## 2017-04-15 NOTE — Telephone Encounter (Signed)
LMOVM that Dr Glo Herring was out of the office until Wednesday and any pain medication would need to picked up and could not be electronically sent. Informed we could try to move appt to different day if that was an option.

## 2017-05-13 ENCOUNTER — Encounter: Payer: BLUE CROSS/BLUE SHIELD | Admitting: Obstetrics and Gynecology

## 2017-05-24 ENCOUNTER — Encounter: Payer: BLUE CROSS/BLUE SHIELD | Admitting: Obstetrics and Gynecology

## 2017-06-05 ENCOUNTER — Encounter: Payer: BLUE CROSS/BLUE SHIELD | Admitting: Obstetrics and Gynecology

## 2017-08-15 DIAGNOSIS — T148XXD Other injury of unspecified body region, subsequent encounter: Secondary | ICD-10-CM | POA: Diagnosis not present

## 2017-08-15 DIAGNOSIS — Z6837 Body mass index (BMI) 37.0-37.9, adult: Secondary | ICD-10-CM | POA: Diagnosis not present

## 2017-08-15 DIAGNOSIS — D485 Neoplasm of uncertain behavior of skin: Secondary | ICD-10-CM | POA: Diagnosis not present

## 2017-08-15 DIAGNOSIS — Z1389 Encounter for screening for other disorder: Secondary | ICD-10-CM | POA: Diagnosis not present

## 2017-08-23 DIAGNOSIS — L986 Other infiltrative disorders of the skin and subcutaneous tissue: Secondary | ICD-10-CM | POA: Diagnosis not present

## 2017-08-23 DIAGNOSIS — D485 Neoplasm of uncertain behavior of skin: Secondary | ICD-10-CM | POA: Diagnosis not present

## 2017-09-02 DIAGNOSIS — L308 Other specified dermatitis: Secondary | ICD-10-CM | POA: Diagnosis not present

## 2017-09-05 DIAGNOSIS — Z6836 Body mass index (BMI) 36.0-36.9, adult: Secondary | ICD-10-CM | POA: Diagnosis not present

## 2017-09-05 DIAGNOSIS — E6609 Other obesity due to excess calories: Secondary | ICD-10-CM | POA: Diagnosis not present

## 2017-09-05 DIAGNOSIS — Z1389 Encounter for screening for other disorder: Secondary | ICD-10-CM | POA: Diagnosis not present

## 2017-09-05 DIAGNOSIS — J069 Acute upper respiratory infection, unspecified: Secondary | ICD-10-CM | POA: Diagnosis not present

## 2017-09-24 ENCOUNTER — Encounter: Payer: Self-pay | Admitting: Obstetrics and Gynecology

## 2017-09-24 ENCOUNTER — Ambulatory Visit: Payer: BLUE CROSS/BLUE SHIELD | Admitting: Obstetrics and Gynecology

## 2017-09-24 ENCOUNTER — Encounter (INDEPENDENT_AMBULATORY_CARE_PROVIDER_SITE_OTHER): Payer: Self-pay

## 2017-09-24 DIAGNOSIS — N920 Excessive and frequent menstruation with regular cycle: Secondary | ICD-10-CM | POA: Diagnosis not present

## 2017-09-24 DIAGNOSIS — Z113 Encounter for screening for infections with a predominantly sexual mode of transmission: Secondary | ICD-10-CM

## 2017-09-24 MED ORDER — MEGESTROL ACETATE 40 MG PO TABS: 40.0000 mg | ORAL_TABLET | Freq: Three times a day (TID) | ORAL | 2 refills | Status: DC

## 2017-09-24 NOTE — Progress Notes (Signed)
[place] [facility] Visit  @DATE @            Patient name: Chloe Orr MRN [id number]  Date of birth: [birth year]  CC & HPI:  Chloe Orr is a 42 y.o. female presenting today for heavy vaginal bleeding.  She has known uterine fibroids 16-20 weeks size that have been relatively asymptomatic.  She is been on birth control pills which is controlled her periods she is not having pressure symptoms at present she has noticed abdominal thickness is made worse by the fibroids as well as her weight.  The patient is not been able to lose weight as previously discussed at prior visits  ROS:  ROS Bleeding off and on over the last 2 months  Pertinent History Reviewed:   Reviewed: Significant for Pap smear reported as normal, done last year by her primary care according to patient.  Records currently unavailable Medical         Past Medical History:  Diagnosis Date  . Fibroid uterus   . Hypercholesteremia                               Surgical Hx:    Past Surgical History:  Procedure Laterality Date  . [place], 2010   [facility]  . MASS EXCISION  2011   right arm mass excision  . MASS EXCISION  02/11/2012   Procedure: EXCISION MASS;  Surgeon: Jamesetta So, MD;  Location: [facility];  Service: General;  Laterality: Left;  Excision Neoplasm Left Axilla  . TUBAL LIGATION     Medications: Reviewed & Updated - see associated section                       Current Outpatient Medications:  .  Fish Oil-Cholecalciferol (FISH OIL + D3 PO), Take 1 capsule by mouth at bedtime., Disp: , Rfl:  .  niacin 500 MG tablet, Take 500 mg by mouth at bedtime., Disp: , Rfl:  .  simvastatin (ZOCOR) 20 MG tablet, Take 20 mg by mouth at bedtime., Disp: , Rfl:  .  SPRINTEC 28 0.25-35 MG-MCG tablet, TAKE ONE TABLET BY MOUTH ONCE DAILY, Disp: 28 tablet, Rfl: 11 .  Lorcaserin HCl (BELVIQ) 10 MG TABS, Take by mouth., Disp: , Rfl:  .  megestrol (MEGACE) 40 MG tablet, Take 1 tablet (40 mg total) by  mouth 3 (three) times daily., Disp: 45 tablet, Rfl: 2 .  traMADol (ULTRAM) 50 MG tablet, Take 1 tablet (50 mg total) by mouth every 6 (six) hours as needed for moderate pain or severe pain. (Patient not taking: Reported on 09/24/2017), Disp: 30 tablet, Rfl: 0   Social History: Reviewed -  reports that  has never smoked. she has never used smokeless tobacco.  Objective Findings:  Vitals: Blood pressure 120/80, pulse 96, height 4\' 11"  (1.499 m), weight 182 lb 4.8 oz (82.7 kg), last menstrual period 09/16/2017.  PHYSICAL EXAMINATION General appearance - alert, well appearing, and in no distress, oriented to person, place, and time, overweight and well hydrated Mental status - alert, oriented to person, place, and time, normal mood, behavior, speech, dress, motor activity, and thought processes Chest - not examined Heart - normal rate and regular rhythm Abdomen - soft, nontender, nondistended, no masses or organomegaly Mass palpable to the umbilicus beneath a firm abdominal wall consistent with fibroid uterus Breasts -  Skin - normal  coloration and turgor, no rashes, no suspicious skin lesions noted  PELVIC External genitalia -normal Vulva -normal Vagina -heavy menstrual blood with clots Cervix -multiparous Uterus -good support the uterus is very high almost in the abdominal cavity not much of the uterus is in the pelvis.  Bimanual exam confirms that the mass in the abdomen is the fibroid uterus.  I cannot distinguish the ovary from the uterus Adnexa -mobile mass to the umbilicus [place] -  Rectal - rectal exam not indicated    Assessment & Plan:   A:  1. Menorrhagia, abnormal uterine bleeding associated with fibroid enlargement of the uterus 2. 16-20-week fibroid uterus  P:  1. Patient is not currently interested in surgical treatment. 2. 2.  We will attempt Megace 40 mg 3 times daily 3.  4. Follow-up 3 months or as needed

## 2017-09-26 LAB — GC/CHLAMYDIA PROBE AMP
Chlamydia trachomatis, NAA: NEGATIVE
Neisseria gonorrhoeae by PCR: NEGATIVE

## 2017-09-30 DIAGNOSIS — L308 Other specified dermatitis: Secondary | ICD-10-CM | POA: Diagnosis not present

## 2017-11-05 ENCOUNTER — Ambulatory Visit (HOSPITAL_COMMUNITY)
Admission: RE | Admit: 2017-11-05 | Discharge: 2017-11-05 | Disposition: A | Discharge status: Home / Self Care | Payer: BLUE CROSS/BLUE SHIELD | Source: Ambulatory Visit | Attending: Nurse Practitioner | Admitting: Nurse Practitioner

## 2017-11-05 ENCOUNTER — Encounter: Payer: Self-pay | Admitting: Nurse Practitioner

## 2017-11-05 ENCOUNTER — Ambulatory Visit (INDEPENDENT_AMBULATORY_CARE_PROVIDER_SITE_OTHER): Payer: BLUE CROSS/BLUE SHIELD | Admitting: Nurse Practitioner

## 2017-11-05 DIAGNOSIS — K5900 Constipation, unspecified: Secondary | ICD-10-CM | POA: Insufficient documentation

## 2017-11-05 DIAGNOSIS — R140 Abdominal distension (gaseous): Secondary | ICD-10-CM | POA: Diagnosis not present

## 2017-11-05 MED ORDER — LINACLOTIDE 145 MCG PO CAPS: 145.0000 ug | ORAL_CAPSULE | Freq: Every day | ORAL | 0 refills | Status: DC

## 2017-11-05 NOTE — Patient Instructions (Signed)
1. Have your abdominal x-ray done when you can. 2. I am giving you samples of Linzess 145 mg.  Take this once a day, on an empty stomach. 3. As we discussed, Linzess may cause diarrhea.  If it does it typically resolves within 5 days.  Let us know if it is severe or if it does not resolve. 4. Regardless, call us in 2 weeks and let us know if the medication is helping. 5. Follow-up in 2 months. 6. Call us if you have any questions or concerns.   It was great to see you today!  Have a wonderful spring and start of summer!!!    At [facility] we value your feedback. You may receive a survey about your visit today. Please share your experience as we strive to create trusting relationships with our patients to provide genuine, compassionate, quality care.

## 2017-11-05 NOTE — Progress Notes (Signed)
Primary Care Physician:  Scherrie Bateman Primary Gastroenterologist:  Dr. Oneida Alar  Chief Complaint  Patient presents with  . Bloated    "feels tight"  . Constipation    every 3-4 days "took 6 laxatives on saturday"    HPI:   Chloe Orr is a 42 y.o. female who presents for evaluation of bloating.  The patient is not been seen by our office since 09/02/2014 for shingles outbreak.  At that time it was noted she had rash in her stomach, leg and left arm for about 7 days.  She was given medications.  Some vomiting that morning, started a laxative because she felt she was backed up but now having loose stools.  Increased stress.  The Dexilant, ibuprofen, Neurontin and complete Valtrex.  Phenergan for nausea or vomiting.  Today she states she's doing ok overall. Has had some distended, bloated abdomen. Bowel movement every 304 days, has taken up to 6 laxatives at a time with only one bowel movement. Denies abdominal pain, N/V, hematochezia, melnea, fever, unintentional weight loss. Has had constipation for the past 20-some years ago. Has never had it evaluated before. Feels like she looks pregnant. Drinks significant water water daily. Has started a new diet recently, no meat, carbs diet. Eats mostly fish, corn, greens/vegetables. Denies chest pain, dyspnea, dizziness, lightheadedness, syncope, near syncope. Denies any other upper or lower GI symptoms.  Admits lower abdominal fullness to be known uterine fibroid and is scheduled to see GYN for likely removal in the next few months.  Past Medical History:  Diagnosis Date  . Fibroid uterus   . Hypercholesteremia     Past Surgical History:  Procedure Laterality Date  . BREAST REDUCTION SURGERY Bilateral 09/20/2016   [place]  . [place], 2010   [facility]  . MASS EXCISION  2011   right arm mass excision  . MASS EXCISION  02/11/2012   Procedure: EXCISION MASS;  Surgeon: Jamesetta So, MD;  Location: [facility];   Service: General;  Laterality: Left;  Excision Neoplasm Left Axilla  . TUBAL LIGATION      Current Outpatient Medications  Medication Sig Dispense Refill  . docusate sodium (COLACE) 100 MG capsule Take 100 mg by mouth daily as needed for mild constipation.    . Fish Oil-Cholecalciferol (FISH OIL + D3 PO) Take 1 capsule by mouth at bedtime.    . megestrol (MEGACE) 40 MG tablet Take 1 tablet (40 mg total) by mouth 3 (three) times daily. 45 tablet 2  . niacin 500 MG tablet Take 500 mg by mouth at bedtime.    . simvastatin (ZOCOR) 20 MG tablet Take 20 mg by mouth at bedtime.    . SPRINTEC 28 0.25-35 MG-MCG tablet TAKE ONE TABLET BY MOUTH ONCE DAILY 28 tablet 11   No current facility-administered medications for this visit.     Allergies as of 11/05/2017  . (No Known Allergies)    Family History  Problem Relation Age of Onset  . Diabetes Father   . Birth defects Sister        cleft lip   . Colon cancer Neg Hx   . Gastric cancer Neg Hx   . Esophageal cancer Neg Hx     Social History   Socioeconomic History  . Marital status: Single    Spouse name: Not on file  . Number of children: Not on file  . Years of education: Not on file  . Highest education  level: Not on file  Occupational History  . Not on file  Social Needs  . Financial resource strain: Not on file  . Food insecurity:    Worry: Not on file    Inability: Not on file  . Transportation needs:    Medical: Not on file    Non-medical: Not on file  Tobacco Use  . Smoking status: Never Smoker  . Smokeless tobacco: Never Used  Substance and Sexual Activity  . Alcohol use: Yes    Comment: Currently (11/05/17): occasional glass of wine. Previously 3 beer daily, stopped drinking beer 10/21/17.  . Drug use: No  . Sexual activity: Yes    Birth control/protection: Surgical  Lifestyle  . Physical activity:    Days per week: Not on file    Minutes per session: Not on file  . Stress: Not on file  Relationships  . Social  connections:    Talks on phone: Not on file    Gets together: Not on file    Attends religious service: Not on file    Active member of club or organization: Not on file    Attends meetings of clubs or organizations: Not on file    Relationship status: Not on file  . Intimate partner violence:    Fear of current or ex partner: Not on file    Emotionally abused: Not on file    Physically abused: Not on file    Forced sexual activity: Not on file  Other Topics Concern  . Not on file  Social History Narrative  . Not on file    Review of Systems: Complete ROS negative except as per HPI.    Physical Exam: BP (!) 152/101   Pulse 90   Temp (!) 97.3 F (36.3 C) (Oral)   Ht 4\' 11"  (1.499 m)   Wt 182 lb 9.6 oz (82.8 kg)   BMI 36.88 kg/m  General:   Alert and oriented. Pleasant and cooperative. Well-nourished and well-developed.  Eyes:  Without icterus, sclera clear and conjunctiva pink.  Ears:  Normal auditory acuity. Cardiovascular:  S1, S2 present without murmurs appreciated. Extremities without clubbing or edema. Respiratory:  Clear to auscultation bilaterally. No wheezes, rales, or rhonchi. No distress.  Gastrointestinal:  +BS, rounded but soft, non-tender and non-distended. No HSM noted. Small area of firmness mid-lower abdomen corresponding to known fibroid was noted. No guarding or rebound. No masses appreciated.  Rectal:  Deferred  Musculoskalatal:  Symmetrical without gross deformities.  Neurologic:  Alert and oriented x4;  grossly normal neurologically. Psych:  Alert and cooperative. Normal mood and affect. Heme/Lymph/Immune: No excessive bruising noted.    11/05/2017 9:58 AM   Disclaimer: This note was dictated with voice recognition software. Similar sounding words can inadvertently be transcribed and may not be corrected upon review.

## 2017-11-05 NOTE — Assessment & Plan Note (Signed)
Patient notes worsening bloating in association with her constipation.  She feels like she is pregnant.  She does have a rounded abdomen but it is overall soft other than an area of firmness in the mid lower abdomen corresponding to known uterine fibroid.  She has plans to have this evaluated for likely surgical removal.  Bloating is likely due to constipation.  I will have an abdominal x-ray completed for stool burden as well to assess the area of firmness to ensure it is not some other concerning mass.  Follow-up in 2 months.  Recommend she follow-up with gynecology.

## 2017-11-05 NOTE — Assessment & Plan Note (Signed)
The patient has chronic constipation for a number of years.  This seems to be worsening.  She has associated bloating, but denies abdominal pain or hematochezia.  No colonoscopy on file but she is only 42 years old.  This point we will check an abdominal x-ray.  I will start her on Linzess 145 mg.  She is to call us in 2 weeks and let us know if it is helping.  Follow-up in 2 months.

## 2017-11-05 NOTE — Progress Notes (Signed)
cc'ed to pcp °

## 2017-11-06 ENCOUNTER — Ambulatory Visit (HOSPITAL_COMMUNITY)
Admission: RE | Admit: 2017-11-06 | Discharge: 2017-11-06 | Disposition: A | Discharge status: Home / Self Care | Payer: BLUE CROSS/BLUE SHIELD | Source: Ambulatory Visit | Attending: Nurse Practitioner | Admitting: Nurse Practitioner

## 2017-11-06 DIAGNOSIS — K5900 Constipation, unspecified: Secondary | ICD-10-CM | POA: Diagnosis not present

## 2017-11-07 ENCOUNTER — Telehealth: Payer: Self-pay | Admitting: General Practice

## 2017-11-07 NOTE — Telephone Encounter (Signed)
Pateint called in wanting X-ray results and she can be reached at [phone].  Routing to [employer]

## 2017-11-11 ENCOUNTER — Telehealth: Payer: Self-pay | Admitting: Gastroenterology

## 2017-11-11 NOTE — Telephone Encounter (Signed)
Pt was calling to see if her xray results were back yet. Please call [phone]

## 2017-11-11 NOTE — Telephone Encounter (Signed)
Previous note to sent to EG on 11/07/2017. Randall Hiss, please advise!

## 2017-11-12 NOTE — Telephone Encounter (Signed)
PT is aware.

## 2017-11-12 NOTE — Telephone Encounter (Signed)
See result note.  

## 2017-11-12 NOTE — Progress Notes (Signed)
PT is aware and is so excited that the [place] is working so well.  She said she has been taking it since 11/05/2017 and it has helped her so much. Stools are a little loose but she feels so much better and doesn't mind the loose stools. She will call if it gets too loose.

## 2017-11-17 ENCOUNTER — Emergency Department (HOSPITAL_COMMUNITY): Payer: BLUE CROSS/BLUE SHIELD

## 2017-11-17 ENCOUNTER — Encounter (HOSPITAL_COMMUNITY): Payer: Self-pay | Admitting: Emergency Medicine

## 2017-11-17 ENCOUNTER — Other Ambulatory Visit: Payer: Self-pay

## 2017-11-17 ENCOUNTER — Emergency Department (HOSPITAL_COMMUNITY)
Admission: EM | Admit: 2017-11-17 | Discharge: 2017-11-17 | Disposition: A | Discharge status: Home / Self Care | Payer: BLUE CROSS/BLUE SHIELD | Attending: Emergency Medicine | Admitting: Emergency Medicine

## 2017-11-17 DIAGNOSIS — Z79899 Other long term (current) drug therapy: Secondary | ICD-10-CM | POA: Insufficient documentation

## 2017-11-17 DIAGNOSIS — R102 Pelvic and perineal pain: Secondary | ICD-10-CM | POA: Diagnosis not present

## 2017-11-17 DIAGNOSIS — R110 Nausea: Secondary | ICD-10-CM | POA: Diagnosis not present

## 2017-11-17 DIAGNOSIS — D259 Leiomyoma of uterus, unspecified: Secondary | ICD-10-CM | POA: Insufficient documentation

## 2017-11-17 DIAGNOSIS — D219 Benign neoplasm of connective and other soft tissue, unspecified: Secondary | ICD-10-CM

## 2017-11-17 DIAGNOSIS — R1030 Lower abdominal pain, unspecified: Secondary | ICD-10-CM | POA: Diagnosis present

## 2017-11-17 LAB — BASIC METABOLIC PANEL
Anion gap: 12 (ref 5–15)
BUN: 7 mg/dL (ref 6–20)
CO2: 22 mmol/L (ref 22–32)
Calcium: 9.6 mg/dL (ref 8.9–10.3)
Chloride: 105 mmol/L (ref 101–111)
Creatinine, Ser: 0.89 mg/dL (ref 0.44–1.00)
GFR calc Af Amer: >60 mL/min (ref >60)
GFR calc non Af Amer: >60 mL/min (ref >60)
Glucose, Bld: 109 mg/dL — ABNORMAL HIGH (ref 65–99)
Potassium: 3.7 mmol/L (ref 3.5–5.1)
Sodium: 139 mmol/L (ref 135–145)

## 2017-11-17 LAB — CBC WITH DIFFERENTIAL/PLATELET
Basophils Absolute: 0 10*3/uL (ref 0.0–0.1)
Basophils Relative: 0 %
Eosinophils Absolute: 0.2 10*3/uL (ref 0.0–0.7)
Eosinophils Relative: 1 %
HCT: 42 % (ref 36.0–46.0)
Hemoglobin: 13.5 g/dL (ref 12.0–15.0)
Lymphocytes Relative: 12 %
Lymphs Abs: 1.6 10*3/uL (ref 0.7–4.0)
MCH: 29.5 pg (ref 26.0–34.0)
MCHC: 32.1 g/dL (ref 30.0–36.0)
MCV: 91.9 fL (ref 78.0–100.0)
Monocytes Absolute: 1.3 10*3/uL — ABNORMAL HIGH (ref 0.1–1.0)
Monocytes Relative: 9 %
Neutro Abs: 10.7 10*3/uL — ABNORMAL HIGH (ref 1.7–7.7)
Neutrophils Relative %: 78 %
Platelets: 318 10*3/uL (ref 150–400)
RBC: 4.57 MIL/uL (ref 3.87–5.11)
RDW: 14.9 % (ref 11.5–15.5)
WBC: 13.8 10*3/uL — ABNORMAL HIGH (ref 4.0–10.5)

## 2017-11-17 LAB — URINALYSIS, ROUTINE W REFLEX MICROSCOPIC
Bilirubin Urine: NEGATIVE
Glucose, UA: NEGATIVE mg/dL
Ketones, ur: 20 mg/dL — AB
Leukocytes, UA: NEGATIVE
Nitrite: NEGATIVE
Protein, ur: 30 mg/dL — AB
Specific Gravity, Urine: 1.026 (ref 1.005–1.030)
pH: 6 (ref 5.0–8.0)

## 2017-11-17 LAB — POC URINE PREG, ED: Preg Test, Ur: NEGATIVE

## 2017-11-17 MED ORDER — ONDANSETRON HCL 4 MG/2ML IJ SOLN
4.0000 mg | Freq: Once | INTRAMUSCULAR | Status: AC
  Administered 2017-11-17: 4 mg via INTRAVENOUS
  Filled 2017-11-17: qty 2

## 2017-11-17 MED ORDER — MELOXICAM 7.5 MG PO TABS: 7.5000 mg | ORAL_TABLET | Freq: Two times a day (BID) | ORAL | 0 refills | Status: DC

## 2017-11-17 MED ORDER — HYDROMORPHONE HCL 1 MG/ML IJ SOLN
0.5000 mg | Freq: Once | INTRAMUSCULAR | Status: AC
  Administered 2017-11-17: 0.5 mg via INTRAVENOUS
  Filled 2017-11-17: qty 1

## 2017-11-17 MED ORDER — HYDROCODONE-ACETAMINOPHEN 5-325 MG PO TABS: 1.0000 | ORAL_TABLET | ORAL | 0 refills | Status: DC | PRN

## 2017-11-17 NOTE — ED Triage Notes (Addendum)
Patient c/o lower abd pain/pelvic region pain that started yesterday afternoon. Denies any nausea, vomiting, diarrhea, fevers, urinary symptoms, or vaginal bleeding. Per patient feels like contractions. Hx of fibroids. Patient states numbness in both legs.

## 2017-11-17 NOTE — Discharge Instructions (Addendum)
Your vital signs have been reviewed.  The ultrasound shows continued problem with the fibroids.  There is no noted problem with fluid or other emergent changes on your ultrasound exam.  Please use meloxicam 2 times daily with a meal.  May use Ultram every 4 hours as needed for more severe pain. This medication may cause drowsiness. Please do not drink, drive, or participate in activity that requires concentration while taking this medication.  It is important that you see Dr. Glo Herring as soon as possible for gynecology management of this pain and this issue.

## 2017-11-17 NOTE — ED Provider Notes (Signed)
[facility] EMERGENCY DEPARTMENT Provider Note   CSN: [id number] Arrival date & time: 11/17/17  3428     History   Chief Complaint Chief Complaint  Patient presents with  . Abdominal Pain    HPI Chloe Orr is a 42 y.o. female.  Patient is a 42 year old female who presents to the emergency department with lower abdomen pain.  The patient states that this problem has been coming and going over the last 1 or 2 years.  She had severe pain started on yesterday afternoon.  The pain is mostly in the lower abdomen.  Patient states it feels like contractions at times, then followed by severe sharp pain.  Patient states she has a history of fibroids, and is presently on birth control and Megace.  She states the abnormal bleeding has mostly stopped, she has not had any recent fever.  The pain however seems to be getting progressively worse.  She presents to the emergency department for additional evaluation, and assistance with pain.     Past Medical History:  Diagnosis Date  . Fibroid uterus   . Hypercholesteremia     Patient Active Problem List   Diagnosis Date Noted  . Constipation 11/05/2017  . Bloating 11/05/2017  . Menorrhagia with irregular cycle 10/05/2015  . Fibroid uterus 10/05/2015  . Shingles outbreak 09/02/2014    Past Surgical History:  Procedure Laterality Date  . BREAST REDUCTION SURGERY Bilateral 09/20/2016   [place]  . [place], 2010   [facility]  . MASS EXCISION  2011   right arm mass excision  . MASS EXCISION  02/11/2012   Procedure: EXCISION MASS;  Surgeon: Jamesetta So, MD;  Location: [facility];  Service: General;  Laterality: Left;  Excision Neoplasm Left Axilla  . TUBAL LIGATION       OB History    Gravida  3   Para  2   Term  2   Preterm      AB  1   Living  2     SAB      TAB      Ectopic      Multiple      Live Births               Home Medications    Prior to Admission medications   Medication  Sig Start Date End Date Taking? Authorizing Provider  docusate sodium (COLACE) 100 MG capsule Take 100 mg by mouth daily as needed for mild constipation.    [provider]  Fish Oil-Cholecalciferol (FISH OIL + D3 PO) Take 1 capsule by mouth at bedtime.    [provider]  linaclotide Rolan Lipa) 145 MCG CAPS capsule Take 1 capsule (145 mcg total) by mouth daily before breakfast. 11/05/17   Carlis Stable, NP  megestrol (MEGACE) 40 MG tablet Take 1 tablet (40 mg total) by mouth 3 (three) times daily. 09/24/17   [name], MD  niacin 500 MG tablet Take 500 mg by mouth at bedtime.    [provider]  simvastatin (ZOCOR) 20 MG tablet Take 20 mg by mouth at bedtime.    [provider]  [place] 28 0.25-35 MG-MCG tablet TAKE ONE TABLET BY MOUTH ONCE DAILY 03/07/17   [name], MD    Family History Family History  Problem Relation Age of Onset  . Diabetes Father   . Birth defects Sister        cleft lip   .  Colon cancer Neg Hx   . Gastric cancer Neg Hx   . Esophageal cancer Neg Hx     Social History Social History   Tobacco Use  . Smoking status: Never Smoker  . Smokeless tobacco: Never Used  Substance Use Topics  . Alcohol use: Yes    Comment: Currently (11/05/17): occasional glass of wine. Previously 3 beer daily, stopped drinking beer 10/21/17.  . Drug use: No     Allergies   Patient has no known allergies.   Review of Systems Review of Systems  Constitutional: Negative for activity change.       All ROS Neg except as noted in HPI  HENT: Negative for nosebleeds.   Eyes: Negative for photophobia and discharge.  Respiratory: Negative for cough, shortness of breath and wheezing.   Cardiovascular: Negative for chest pain and palpitations.  Gastrointestinal: Positive for nausea. Negative for abdominal pain and blood in stool.  Genitourinary: Positive for pelvic pain. Negative for dysuria, frequency, hematuria and vaginal discharge.    Musculoskeletal: Negative for arthralgias, back pain and neck pain.  Skin: Negative.   Neurological: Negative for dizziness, seizures and speech difficulty.  Psychiatric/Behavioral: Negative for confusion and hallucinations.     Physical Exam Updated Vital Signs BP 126/77 (BP Location: Right Arm)   Pulse 99   Temp 98.1 F (36.7 C) (Oral)   Resp 18   Ht 4\' 11"  (1.499 m)   Wt 82.1 kg (181 lb)   SpO2 97%   BMI 36.56 kg/m   Physical Exam  Constitutional: She is oriented to person, place, and time. She appears well-developed and well-nourished.  Non-toxic appearance.  HENT:  Head: Normocephalic.  Right Ear: Tympanic membrane and external ear normal.  Left Ear: Tympanic membrane and external ear normal.  Eyes: Pupils are equal, round, and reactive to light. EOM and lids are normal.  Neck: Normal range of motion. Neck supple. Carotid bruit is not present.  Cardiovascular: Normal rate, regular rhythm, normal heart sounds, intact distal pulses and normal pulses.  Pulmonary/Chest: Breath sounds normal. No respiratory distress.  Abdominal: Soft. Bowel sounds are normal. There is no splenomegaly or hepatomegaly. There is tenderness in the right lower quadrant, suprapubic area and left lower quadrant. There is guarding.  Musculoskeletal: Normal range of motion.  Lymphadenopathy:       Head (right side): No submandibular adenopathy present.       Head (left side): No submandibular adenopathy present.    She has no cervical adenopathy.  Neurological: She is alert and oriented to person, place, and time. She has normal strength. No cranial nerve deficit or sensory deficit.  Skin: Skin is warm and dry.  Psychiatric: She has a normal mood and affect. Her speech is normal.  Nursing note and vitals reviewed.    ED Treatments / Results  Labs (all labs ordered are listed, but only abnormal results are displayed) Labs Reviewed  URINALYSIS, ROUTINE W REFLEX MICROSCOPIC  CBC WITH  DIFFERENTIAL/PLATELET  BASIC METABOLIC PANEL  POC URINE PREG, ED    EKG None  Radiology No results found.  Procedures Procedures (including critical care time)  Medications Ordered in ED Medications  HYDROmorphone (DILAUDID) injection 0.5 mg (has no administration in time range)  ondansetron (ZOFRAN) injection 4 mg (has no administration in time range)     Initial Impression / Assessment and Plan / ED Course  I have reviewed the triage vital signs and the nursing notes.  Pertinent labs & imaging results that were available during  my care of the patient were reviewed by me and considered in my medical decision making (see chart for details).       Final Clinical Impressions(s) / ED Diagnoses  MDm  Vital signs within normal limits.  Pulse oximetry is 99% on room air.  Within normal limits by my interpretation.  Patient presented to the emergency department with lower abdomen and pelvic area pain.  Patient has a history of fibroids.  No high fever reported.  No injury to the abdomen.  No blood noted in the urine or the stool.  No unusual discharge.  The patient has had some problems with vaginal bleeding in the past, but she is on Megace and this problem has resolved.  Patient treated with 0.5 mg of Dilaudid for pain.  Urine pregnancy test is negative.  Basic metabolic panel is within normal limits.  Recheck patient states that the pain is improved significantly.  Complete blood count shows white blood cells to be elevated at 13.8, no shift to the left.  The complete blood count is otherwise within normal limits.  I reviewed the previous imaging for this patient.  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  Will obtain an ultrasound to rule out any form of an abscess, and or ovarian torsion.  The ultrasound shows the uterus to be enlarged with several fibroids present.  The ovaries could not be seen at this time.   The patient however does not appear to have pain, and this problem is been going on now for about 48 to 72 hours.  Patient states that the pain is similar to fibroid pain that she has had in the past.  I have advised the patient of the findings of the examination as well as the findings of the laboratory work and ultrasound.  I have strongly encouraged patient to see Dr. Glo Herring in the office this week concerning this fibroid issue.  The patient states that she will be very diligent to be in touch with Dr. Glo Herring this week.  She will return to the emergency department if any emergent changes, problems, or concerns.  Patient is amatory at the time of discharge.   Final diagnoses:  Acute pain in female pelvis  Fibroids    ED Discharge Orders    None       Lily Kocher, PA-C 11/17/17 1508    Dorie Rank, MD 11/17/17 1544

## 2017-11-17 NOTE — ED Notes (Signed)
Pt transported to US

## 2017-11-18 ENCOUNTER — Telehealth: Payer: Self-pay | Admitting: *Deleted

## 2017-11-18 NOTE — Telephone Encounter (Signed)
Patient states she went to the ER this weekend for pain related to her fibroids.  She is now ready to have surgery.  Informed patient she will need to have a visit with Dr Glo Herring first to discuss the surgery.  Patient verbalized understanding and appointment made.

## 2017-11-19 ENCOUNTER — Telehealth: Payer: Self-pay | Admitting: Obstetrics and Gynecology

## 2017-11-19 ENCOUNTER — Telehealth: Payer: Self-pay | Admitting: *Deleted

## 2017-11-19 NOTE — Telephone Encounter (Signed)
Left message with the patient regarding the possibility of using Lupron to suppress the fibroid enlargement and make surgery easier.  Patient is asked to call the office that she is interested otherwise will speak to her on Friday

## 2017-11-19 NOTE — Telephone Encounter (Signed)
Patient called in requesting her linzess to be increased. Reports the past few days her current dosage of 145 mcg has not done anything for her. Please advise Randall Hiss thanks

## 2017-11-20 NOTE — Telephone Encounter (Signed)
Spoke with patient and she is aware of EG recs. She will come by and pick up samples. These have been left upfront

## 2017-11-20 NOTE — Telephone Encounter (Signed)
Yes. She can pick up samples of 290 mcg from our office (2 weeks worth) to try. Call us in 2 weeks and let us know if this helps and if so we can send in an Rx. If she doesn't want to come pick up samples, she can take 2 of her 145 mcg pills instead (though this may cause her to run out early).

## 2017-11-20 NOTE — Telephone Encounter (Signed)
Chloe Orr the patient would like to know if she could try the 290 mcg dose of Linzess.

## 2017-11-22 ENCOUNTER — Ambulatory Visit: Payer: BLUE CROSS/BLUE SHIELD | Admitting: Obstetrics and Gynecology

## 2017-11-22 ENCOUNTER — Encounter: Payer: Self-pay | Admitting: Obstetrics and Gynecology

## 2017-11-22 ENCOUNTER — Other Ambulatory Visit: Payer: Self-pay | Admitting: Obstetrics and Gynecology

## 2017-11-22 VITALS — BP 112/80 | HR 95 | Ht 59.0 in | Wt 183.6 lb

## 2017-11-22 DIAGNOSIS — Z01818 Encounter for other preprocedural examination: Secondary | ICD-10-CM

## 2017-11-22 DIAGNOSIS — D259 Leiomyoma of uterus, unspecified: Secondary | ICD-10-CM | POA: Diagnosis not present

## 2017-11-22 DIAGNOSIS — R109 Unspecified abdominal pain: Secondary | ICD-10-CM | POA: Diagnosis not present

## 2017-11-22 DIAGNOSIS — N951 Menopausal and female climacteric states: Secondary | ICD-10-CM | POA: Diagnosis not present

## 2017-11-22 DIAGNOSIS — N858 Other specified noninflammatory disorders of uterus: Secondary | ICD-10-CM | POA: Diagnosis not present

## 2017-11-22 NOTE — Progress Notes (Addendum)
[facility] Visit  11/22/2017            Patient name: Chloe Orr MRN [id number]  Date of birth: 14-Dec-1975  CC & HPI:  Chloe Orr is a 42 y.o. female presenting today for a discussion of her fibroids. She was last seen in office on 09/24/17, for menorrhagia, AUB, associated with fibroid enlargement of the uterus (16-20 week size). Pt was not interested in surgical treatment then, and advised to start Megace 40 mg, TID, and follow up in 3 months or as needed. She notes hot flashes,  intermittent pain in her lower abdomen that has occurred for the last 1-2 years. She recently went to the ER on 11/17/17 for the abdominal pain, and said it felt like contractions or sharp pains. An U/S was done, and ovaries were unable to be visualized due to fibroid size. She is taking Megace and Linzess. No alleviating factors noted at the time.   Last pap was in 2018, done by PCP Delman Cheadle at [facility], records unavailable,, will be requested  ROS:  ROS (+) fibroid uterus (+) abdominal pain (+) hot flashes All systems are negative except as noted in the HPI and PMH.   Pertinent History Reviewed:   Reviewed: Significant for fibroid uterus, C/S, tubal ligation Medical         Past Medical History:  Diagnosis Date  . Fibroid uterus   . Hypercholesteremia                               Surgical Hx:    Past Surgical History:  Procedure Laterality Date  . BREAST REDUCTION SURGERY Bilateral 09/20/2016   [place]  . [place], 2010   [facility]  . MASS EXCISION  2011   right arm mass excision  . MASS EXCISION  02/11/2012   Procedure: EXCISION MASS;  Surgeon: Jamesetta So, MD;  Location: [facility];  Service: General;  Laterality: Left;  Excision Neoplasm Left Axilla  . TUBAL LIGATION     Medications: Reviewed & Updated - see associated section                       Current Outpatient Medications:  .  docusate sodium (COLACE) 100 MG capsule, Take 100 mg by mouth  daily as needed for mild constipation., Disp: , Rfl:  .  Fish Oil-Cholecalciferol (FISH OIL + D3 PO), Take 1 capsule by mouth at bedtime., Disp: , Rfl:  .  linaclotide (LINZESS) 145 MCG CAPS capsule, Take 1 capsule (145 mcg total) by mouth daily before breakfast., Disp: 16 capsule, Rfl: 0 .  megestrol (MEGACE) 40 MG tablet, Take 1 tablet (40 mg total) by mouth 3 (three) times daily., Disp: 45 tablet, Rfl: 2 .  meloxicam (MOBIC) 7.5 MG tablet, Take 1 tablet (7.5 mg total) by mouth 2 (two) times daily after a meal., Disp: 12 tablet, Rfl: 0 .  niacin 500 MG tablet, Take 500 mg by mouth at bedtime., Disp: , Rfl:  .  simvastatin (ZOCOR) 20 MG tablet, Take 20 mg by mouth at bedtime., Disp: , Rfl:  .  rOPINIRole (REQUIP) 1 MG tablet, , Disp: , Rfl: 1 .  SPRINTEC 28 0.25-35 MG-MCG tablet, TAKE ONE TABLET BY MOUTH ONCE DAILY (Patient not taking: Reported on 11/22/2017), Disp: 28 tablet, Rfl: 11   Social History: Reviewed -  reports that she has  never smoked. She has never used smokeless tobacco.  Objective Findings:  Vitals: Blood pressure 112/80, pulse 95, height 4\' 11"  (1.499 m), weight 183 lb 9.6 oz (83.3 kg).  PHYSICAL EXAMINATION General appearance - alert, well appearing, and in no distress and normal appearing weight Mental status - normal mood, behavior, speech, dress, motor activity, and thought processes Abdomen - hard, firm  PELVIC External genitalia - normal for age 41 - normal premenopausal Vagina - normal GC CHL done on pap Pap taken Cervix - Rectal - Not indicated  Endometrial Biopsy: Patient given informed consent, signed copy in the chart, time out was performed. Time out taken. The patient was placed in the lithotomy position and the cervix brought into view with sterile speculum.  Portio of cervix cleansed x 2 with betadine swabs.  A tenaculum was placed in the anterior lip of the cervix. The uterus was sounded for depth of 12 cm,. Milex uterine Explora 3 mm was introduced  to into the uterus, suction created,  and an endometrial sample was obtained. All equipment was removed and accounted for.   The patient tolerated the procedure well.   Discussion: 1. Discussed with pt risks and benefits of removing her fibroids surgically. Pt wants to know if total hysterectomy was necessary. Different options including leaving/removing cervix, ovaries, and tubes discussed. Pt does not want a complete hysterectomy, and wants to keep her ovaries and cervix. Recovery time noted as 4-6 weeks. Pt interested in going ahead with the surgery.  2. Discussion of taking Lupron to shrink the fibroid. Pt is not interested in taking the shots, due to insurance probably not covering the co-pay.  3. U/S reviewed with pt. 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  At end of discussion, pt had opportunity to ask questions and has no further questions at this time.   Specific discussion  as noted above. Greater than 50% was spent in counseling and coordination of care with the patient.   Total time greater than: 25 minutes.    Assessment & Plan:   A:  1. GC/CHL done 2. Pap done 3. Discussion on how to proceed with fibroid management. Decided on supracervical hysterectomy 4. Endometrial biopsy  P:  1. Schedule supracervical hysterectomy for end of May 2. Continue Megace until surgery 3. Request records from [facility] for pap smear  4.  Pap smear collected as precaution if records cannot be obtained, Latisha C  to coordinate   By signing my name below, I, Izna Ahmed, attest that this documentation has been prepared under the direction and in the presence of [name], MD. Electronically Signed: Jabier Gauss, Medical Scribe. 11/22/17. 10:31 AM.  I personally performed the services described in this documentation, which was SCRIBED in my presence. The recorded information has been reviewed and considered accurate. It has been edited as necessary during review. [name],  MD

## 2017-11-25 LAB — GC/CHLAMYDIA PROBE AMP
Chlamydia trachomatis, NAA: NEGATIVE
Neisseria gonorrhoeae by PCR: NEGATIVE

## 2017-11-26 ENCOUNTER — Telehealth: Payer: Self-pay | Admitting: Obstetrics and Gynecology

## 2017-11-26 NOTE — Telephone Encounter (Signed)
Spoke with pt giving her results on GC/CHL and biopsy. GC/CHL was negative and biopsy showed no malignancy identified. Pt was asking about her surgery. I advised she was having a supracervical hyst so cervix will remain and uterus and possibly ovaries will be removed. Pt states she thinks he said he would leave 1 ovary. Pt had no further questions. [place]

## 2017-11-26 NOTE — Telephone Encounter (Signed)
PLEASE CALL PT WITH RESULTS OF LAB DONE ON Friday, FERG TOLD HER TO CALL AND GET RESULTS THIS WEEK.PT IS HAVING SURGERY ON6-10-2017

## 2017-11-27 ENCOUNTER — Other Ambulatory Visit (HOSPITAL_COMMUNITY)
Admission: RE | Admit: 2017-11-27 | Discharge: 2017-11-27 | Disposition: A | Discharge status: Home / Self Care | Payer: BLUE CROSS/BLUE SHIELD | Source: Ambulatory Visit | Attending: Obstetrics and Gynecology | Admitting: Obstetrics and Gynecology

## 2017-11-27 DIAGNOSIS — Z01818 Encounter for other preprocedural examination: Secondary | ICD-10-CM | POA: Diagnosis not present

## 2017-11-27 NOTE — Addendum Note (Signed)
Addended by: Octaviano Glow on: 11/27/2017 02:31 PM   Modules accepted: Orders

## 2017-11-29 LAB — CYTOLOGY - PAP
Diagnosis: NEGATIVE
HPV: NOT DETECTED

## 2017-12-05 ENCOUNTER — Other Ambulatory Visit: Payer: Self-pay | Admitting: Obstetrics and Gynecology

## 2017-12-06 ENCOUNTER — Telehealth: Payer: Self-pay | Admitting: *Deleted

## 2017-12-06 DIAGNOSIS — L308 Other specified dermatitis: Secondary | ICD-10-CM | POA: Diagnosis not present

## 2017-12-06 MED ORDER — LEUPROLIDE ACETATE 3.75 MG IM KIT: 3.7500 mg | PACK | Freq: Once | INTRAMUSCULAR | 0 refills | Status: DC

## 2017-12-06 NOTE — Telephone Encounter (Signed)
Prescription for Lupron 3.75 mg sent and patient to bring to the office

## 2017-12-09 ENCOUNTER — Other Ambulatory Visit: Payer: Self-pay | Admitting: *Deleted

## 2017-12-09 MED ORDER — LEUPROLIDE ACETATE 3.75 MG IM KIT: 3.7500 mg | PACK | Freq: Once | INTRAMUSCULAR | 0 refills | Status: DC

## 2017-12-10 ENCOUNTER — Telehealth: Payer: Self-pay

## 2017-12-10 DIAGNOSIS — K5900 Constipation, unspecified: Secondary | ICD-10-CM

## 2017-12-10 NOTE — Telephone Encounter (Signed)
Pt called with an update of medication Linzess 290 mcg. Linzess 290 mcg samples are working well and pt would like a RX sent into [facility].

## 2017-12-12 ENCOUNTER — Other Ambulatory Visit (HOSPITAL_COMMUNITY): Payer: BLUE CROSS/BLUE SHIELD

## 2017-12-12 MED ORDER — LINACLOTIDE 290 MCG PO CAPS: 290.0000 ug | ORAL_CAPSULE | Freq: Every day | ORAL | 3 refills | Status: DC

## 2017-12-12 NOTE — Telephone Encounter (Signed)
Rx sent to pharmacy, please notify patient.

## 2017-12-12 NOTE — Telephone Encounter (Signed)
Lmom, pt notified.  

## 2017-12-12 NOTE — Addendum Note (Signed)
Addended by: Gordy Levan, Edmond Ginsberg A on: 12/12/2017 03:38 PM   Modules accepted: Orders

## 2017-12-12 NOTE — Telephone Encounter (Signed)
Pt called again asking about rx for linzess 266mcg. I have left her samples at the front desk. Please send in rx.

## 2017-12-18 NOTE — Patient Instructions (Signed)
Chloe Orr  12/18/2017     @PREFPERIOPPHARMACY @   Your procedure is scheduled on 12/24/2017.  Report to [facility] at 6:15 A.M.  Call this number if you have problems the morning of surgery:  [phone]   Remember: Nothing to eat or drink after midnight      Take these medicines the morning of surgery with A SIP OF WATER Requip, Megace, Colace    Do not wear jewelry, make-up or nail polish.  Do not wear lotions, powders, or perfumes, or deodorant.  Do not shave 48 hours prior to surgery.  Men may shave face and neck.  Do not bring valuables to the hospital.  [facility] is not responsible for any belongings or valuables.  Contacts, dentures or bridgework may not be worn into surgery.  Leave your suitcase in the car.  After surgery it may be brought to your room.  For patients admitted to the hospital, discharge time will be determined by your treatment team.  Patients discharged the day of surgery will not be allowed to drive home.    Please read over the following fact sheets that you were given. Surgical Site Infection Prevention, Anesthesia Post-op Instructions and Care and Recovery After Surgery     PATIENT INSTRUCTIONS POST-ANESTHESIA  IMMEDIATELY FOLLOWING SURGERY:  Do not drive or operate machinery for the first twenty four hours after surgery.  Do not make any important decisions for twenty four hours after surgery or while taking narcotic pain medications or sedatives.  If you develop intractable nausea and vomiting or a severe headache please notify your doctor immediately.  FOLLOW-UP:  Please make an appointment with your surgeon as instructed. You do not need to follow up with anesthesia unless specifically instructed to do so.  WOUND CARE INSTRUCTIONS (if applicable):  Keep a dry clean dressing on the anesthesia/puncture wound site if there is drainage.  Once the wound has quit draining you may leave it open to air.  Generally you should leave the  bandage intact for twenty four hours unless there is drainage.  If the epidural site drains for more than 36-48 hours please call the anesthesia department.  QUESTIONS?:  Please feel free to call your physician or the hospital operator if you have any questions, and they will be happy to assist you.      Supracervical Hysterectomy A supracervical hysterectomy is surgery to remove the top part of the uterus, but not the cervix. You will no longer have menstrual periods or be able to get pregnant after this surgery. The fallopian tubes and ovaries may also be removed (bilateral salpingo-oophorectomy) during this surgery. This surgery is usually performed using a minimally invasive technique called laparoscopy. This technique allows the surgery to be done through small incisions. The minimally invasive technique provides benefits such as less pain, less risk of infection, and shorter recovery time. Tell a health care provider about:  Any allergies you have.  All medicines you are taking, including vitamins, herbs, eye drops, creams, and over-the-counter medicines.  Any problems you or family members have had with anesthetic medicines.  Any blood disorders you have.  Any surgeries you have had.  Any medical conditions you have. What are the risks? Generally, this is a safe procedure. However, as with any procedure, complications can occur. Possible complications include:  Bleeding.  Blood clots in the legs or lung.  Infection.  Injury to surrounding organs.  Problems related to anesthesia.  Conversion to an open abdominal surgery.  Additional surgery later to remove the cervix if you have problems with the cervix.  What happens before the procedure?  Ask your health care provider about changing or stopping your regular medicines.  Do not take aspirin or blood thinners (anticoagulants) for 1 week before the surgery, or as directed by your health care provider.  Do not eat or  drink anything for 8 hours before the surgery, or as directed by your health care provider.  Quit smoking if you smoke.  Arrange for a ride home after surgery and for someone to help you at home during recovery. What happens during the procedure?  You will be given an antibiotic medicine.  An IV tube will be placed in one of your veins. You will be given medicine to make you sleep (general anesthetic).  A gas (carbon dioxide) will be used to inflate your abdomen. This will allow your surgeon to look inside your abdomen, perform your surgery, and treat any other problems found if necessary.  Three or four small incisions will be made in your abdomen. One of these incisions will be made in the area of your belly button (navel). A thin, flexible tube with a tiny camera and light on the end of it (laparoscope) will be inserted into the incision. The camera on the laparoscope sends a picture to a TV screen in the operating room. This gives your surgeon a good view inside the abdomen.  Other surgical instruments will be inserted through the other incisions.  The uterus will be cut into small pieces and removed through the small incisions.  Your incisions will be closed. What happens after the procedure?  You will be taken to a recovery area where your progress will be monitored until you are awake, stable, and taking fluids well. If there are no other problems, you will then be moved to a regular hospital room, or you will be allowed to go home.  You will likely have minimal discomfort after the surgery because the incisions are so small with the laparoscopic technique.  You will be given pain medicine while you are in the hospital and for when you go home.  If a bilateral salpingo-oophorectomy was performed before menopause, you will go through a sudden (abrupt) menopause. This can be helped with hormone medicines. This information is not intended to replace advice given to you by your health  care provider. Make sure you discuss any questions you have with your health care provider. Document Released: 12/26/2007 Document Revised: 12/15/2015 Document Reviewed: 01/09/2013 Elsevier Interactive Patient Education  [employer].

## 2017-12-19 ENCOUNTER — Encounter (HOSPITAL_COMMUNITY)
Admission: RE | Admit: 2017-12-19 | Discharge: 2017-12-19 | Disposition: A | Discharge status: Home / Self Care | Payer: BLUE CROSS/BLUE SHIELD | Source: Ambulatory Visit | Attending: Obstetrics and Gynecology | Admitting: Obstetrics and Gynecology

## 2017-12-19 ENCOUNTER — Other Ambulatory Visit: Payer: Self-pay | Admitting: Obstetrics and Gynecology

## 2017-12-19 ENCOUNTER — Other Ambulatory Visit: Payer: Self-pay

## 2017-12-19 ENCOUNTER — Encounter (HOSPITAL_COMMUNITY): Payer: Self-pay

## 2017-12-19 ENCOUNTER — Telehealth: Payer: Self-pay | Admitting: Obstetrics and Gynecology

## 2017-12-19 DIAGNOSIS — Z01812 Encounter for preprocedural laboratory examination: Secondary | ICD-10-CM | POA: Insufficient documentation

## 2017-12-19 LAB — HCG, SERUM, QUALITATIVE: Preg, Serum: NEGATIVE

## 2017-12-19 LAB — TYPE AND SCREEN
ABO/RH(D): O POS
Antibody Screen: NEGATIVE

## 2017-12-19 LAB — COMPREHENSIVE METABOLIC PANEL
ALT: 58 U/L — ABNORMAL HIGH (ref 14–54)
AST: 31 U/L (ref 15–41)
Albumin: 3.7 g/dL (ref 3.5–5.0)
Alkaline Phosphatase: 52 U/L (ref 38–126)
Anion gap: 8 (ref 5–15)
BUN: 11 mg/dL (ref 6–20)
CO2: 24 mmol/L (ref 22–32)
Calcium: 9.2 mg/dL (ref 8.9–10.3)
Chloride: 106 mmol/L (ref 101–111)
Creatinine, Ser: 0.91 mg/dL (ref 0.44–1.00)
GFR calc Af Amer: >60 mL/min (ref >60)
GFR calc non Af Amer: >60 mL/min (ref >60)
Glucose, Bld: 102 mg/dL — ABNORMAL HIGH (ref 65–99)
Potassium: 4.2 mmol/L (ref 3.5–5.1)
Sodium: 138 mmol/L (ref 135–145)
Total Bilirubin: 1.1 mg/dL (ref 0.3–1.2)
Total Protein: 7.2 g/dL (ref 6.5–8.1)

## 2017-12-19 LAB — CBC
HCT: 41.3 % (ref 36.0–46.0)
Hemoglobin: 13 g/dL (ref 12.0–15.0)
MCH: 28.9 pg (ref 26.0–34.0)
MCHC: 31.5 g/dL (ref 30.0–36.0)
MCV: 91.8 fL (ref 78.0–100.0)
Platelets: 265 10*3/uL (ref 150–400)
RBC: 4.5 MIL/uL (ref 3.87–5.11)
RDW: 14.6 % (ref 11.5–15.5)
WBC: 9.1 10*3/uL (ref 4.0–10.5)

## 2017-12-19 LAB — PROTIME-INR
INR: 0.97
Prothrombin Time: 12.8 seconds (ref 11.4–15.2)

## 2017-12-19 LAB — APTT: aPTT: 29 seconds (ref 24–36)

## 2017-12-19 NOTE — Telephone Encounter (Signed)
PHONE CALL TO PT REGARDING SURGERY.  WE DISCUSSED SEVERAL ITEMS: 1. THE RIGHT OVARIAN DERMOID CYST WILL NEED TO BE REMOVED. I WILL ATTEMPT TO SAVE THE NORMAL RIGHT OVARY TISSUE, IF POSSIBLE , BUT IT MAY BE NECESSARY TO PERFORM A REMOVAL OF THE RIGHT OVARY AND TUBE COMPLETELY. 2. THE PATIENT ASKS THAT WE PLACE A DRAIN , AS SHE HAS FLUID RETENTION PROBLEMS AT INCISION SITES , BOTH AT HER BREAST SURGERY SITE, AND HER AXILLA MASS SURGERY. 3 SHE WANTS ANY ABDOMINAL WALL LAXITY AND EXTRA SKIN IN THE LOWER ABDOMEN REMOVED AS A PART OF THE ACCESS INCISION, AND I WILL MARK THE ABDOMEN AS PART OF PREOP CONVERSATION IN PREOP AREA BEFORE SURGERY.

## 2017-12-23 NOTE — H&P (Signed)
[facility] Visit  11/22/2017            Patient name: Chloe Orr      MRN [id number]  Date of birth: 04-Oct-1975  CC & HPI:  Chloe Orr is a 42 y.o. female presenting today for a discussion of her fibroids. She was last seen in office on 09/24/17, for menorrhagia, AUB, associated with fibroid enlargement of the uterus (16-20 week size). Pt was not interested in surgical treatment then, and advised to start Megace 40 mg, TID, and follow up in 3 months or as needed. She notes hot flashes,  intermittent pain in her lower abdomen that has occurred for the last 1-2 years. She recently went to the ER on 11/17/17 for the abdominal pain, and said it felt like contractions or sharp pains. An U/S was done, and ovaries were unable to be visualized due to fibroid size. She is taking Megace and Linzess. No alleviating factors noted at the time.   Last pap was in 2018, done by PCP Del[facility] C[facility] at [facility], records unavailable,, will be requested Pap repeated 11/27/17, and is normal, and GC and CHL negative.  ROS:  ROS (+) fibroid uterus (+) abdominal pain (+) hot flashes All systems are negative except as noted in the HPI and PMH.   Pertinent History Reviewed:   Reviewed: Significant for fibroid uterus, C/S, tubal ligation Medical             Past Medical History:  Diagnosis Date  . Fibroid uterus   . Hypercholesteremia                               Surgical Hx:         Past Surgical History:  Procedure Laterality Date  . BREAST REDUCTION SURGERY Bilateral 09/20/2016   [place]  . [place], 2010   [facility]  . MASS EXCISION  2011   right arm mass excision  . MASS EXCISION  02/11/2012   Procedure: EXCISION MASS;  Surgeon: Jamesetta So, MD;  Location: [facility];  Service: General;  Laterality: Left;  Excision Neoplasm Left Axilla  . TUBAL LIGATION     Medications: Reviewed & Updated - see associated section                        Current Outpatient Medications:  .  docusate sodium (COLACE) 100 MG capsule, Take 100 mg by mouth daily as needed for mild constipation., Disp: , Rfl:  .  Fish Oil-Cholecalciferol (FISH OIL + D3 PO), Take 1 capsule by mouth at bedtime., Disp: , Rfl:  .  linaclotide (LINZESS) 145 MCG CAPS capsule, Take 1 capsule (145 mcg total) by mouth daily before breakfast., Disp: 16 capsule, Rfl: 0 .  megestrol (MEGACE) 40 MG tablet, Take 1 tablet (40 mg total) by mouth 3 (three) times daily., Disp: 45 tablet, Rfl: 2 .  meloxicam (MOBIC) 7.5 MG tablet, Take 1 tablet (7.5 mg total) by mouth 2 (two) times daily after a meal., Disp: 12 tablet, Rfl: 0 .  niacin 500 MG tablet, Take 500 mg by mouth at bedtime., Disp: , Rfl:  .  simvastatin (ZOCOR) 20 MG tablet, Take 20 mg by mouth at bedtime., Disp: , Rfl:  .  rOPINIRole (REQUIP) 1 MG tablet, , Disp: , Rfl: 1 .  SPRINTEC 28 0.25-35 MG-MCG tablet, TAKE ONE TABLET BY  MOUTH ONCE DAILY (Patient not taking: Reported on 11/22/2017), Disp: 28 tablet, Rfl: 11   Social History: Reviewed -  reports that she has never smoked. She has never used smokeless tobacco.  Objective Findings:  Vitals: Blood pressure 112/80, pulse 95, height 4\' 11"  (1.499 m), weight 183 lb 9.6 oz (83.3 kg).  PHYSICAL EXAMINATION General appearance - alert, well appearing, and in no distress and normal appearing weight Mental status - normal mood, behavior, speech, dress, motor activity, and thought processes Abdomen - hard, firm  PELVIC External genitalia - normal for age 56 - normal premenopausal Vagina - normal GC CHL done on pap Pap taken Cervix - Rectal - Not indicated  Endometrial Biopsy: Patient given informed consent, signed copy in the chart, time out was performed. Time out taken. The patient was placed in the lithotomy position and the cervix brought into view with sterile speculum.  Portio of cervix cleansed x 2 with betadine swabs.  A tenaculum was placed in the anterior  lip of the cervix. The uterus was sounded for depth of 12 cm,. Milex uterine Explora 3 mm was introduced to into the uterus, suction created,  and an endometrial sample was obtained. All equipment was removed and accounted for.   The patient tolerated the procedure well.   Discussion: 1. Discussed with pt risks and benefits of removing her fibroids surgically. Pt wants to know if total hysterectomy was necessary. Different options including leaving/removing cervix, ovaries, and tubes discussed. Pt does not want a complete hysterectomy, and wants to keep her ovaries and cervix. Recovery time noted as 4-6 weeks. Pt interested in going ahead with the surgery.  2. Discussion of taking Lupron to shrink the fibroid. Pt is not interested in taking the shots, due to insurance probably not covering the co-pay.  3. U/S reviewed with pt. 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  Note    PHONE CALL TO PT REGARDING SURGERY. 12/19/17 WE DISCUSSED SEVERAL ITEMS: 1. THE RIGHT OVARIAN DERMOID CYST WILL NEED TO BE REMOVED. I WILL ATTEMPT TO SAVE THE NORMAL RIGHT OVARY TISSUE, IF POSSIBLE , BUT IT MAY BE NECESSARY TO PERFORM A REMOVAL OF THE RIGHT OVARY AND TUBE COMPLETELY. 2. THE PATIENT ASKS THAT WE PLACE A DRAIN , AS SHE HAS FLUID RETENTION PROBLEMS AT INCISION SITES , BOTH AT HER BREAST SURGERY SITE, AND HER AXILLA MASS SURGERY. 3 SHE WANTS ANY ABDOMINAL WALL LAXITY AND EXTRA SKIN IN THE LOWER ABDOMEN REMOVED AS A PART OF THE ACCESS INCISION, AND I WILL MARK THE ABDOMEN AS PART OF PREOP CONVERSATION IN PREOP AREA BEFORE SURGERY.       At end of discussion, pt had opportunity to ask questions and has no further questions at this time.   Specific discussion  as noted above. Greater than 50% was spent in counseling and coordination of care with the patient.   Total time greater than: 25 minutes.   CBC    Component Value Date/Time   WBC 9.1 12/19/2017 0954   RBC 4.50 12/19/2017 0954   HGB 13.0  12/19/2017 0954   HCT 41.3 12/19/2017 0954   PLT 265 12/19/2017 0954   MCV 91.8 12/19/2017 0954   MCH 28.9 12/19/2017 0954   MCHC 31.5 12/19/2017 0954   RDW 14.6 12/19/2017 0954   LYMPHSABS 1.6 11/17/2017 1026   MONOABS 1.3 (H) 11/17/2017 1026   EOSABS 0.2 11/17/2017 1026   BASOSABS 0.0 11/17/2017 1026   BMET    Component Value Date/Time  NA 138 12/19/2017 0954   K 4.2 12/19/2017 0954   CL 106 12/19/2017 0954   CO2 24 12/19/2017 0954   GLUCOSE 102 (H) 12/19/2017 0954   BUN 11 12/19/2017 0954   CREATININE 0.91 12/19/2017 0954   CALCIUM 9.2 12/19/2017 0954   GFRNONAA >60 12/19/2017 0954   GFRAA >60 12/19/2017 0954     Assessment & Plan:   A:  1. GC/CHL done 2. Pap done 3. Discussion on how to proceed with fibroid management. Decided on supracervical hysterectomy 4. Endometrial biopsy  P:  1. Schedule supracervical hysterectomy for end of May 2. Continue Megace until surgery 3. Request records from [facility] for pap smear  4.  Pap smear collected as precaution if records cannot be obtained, Latisha C  to coordinate   By signing my name below, I, Izna Ahmed, attest that this documentation has been prepared under the direction and in the presence of [name], MD. Electronically Signed: Jabier Gauss, Medical Scribe. 11/22/17. 10:31 AM.  I personally performed the services described in this documentation, which was SCRIBED in my presence. The recorded information has been reviewed and considered accurate. It has been edited as necessary during review. [name], MD

## 2017-12-24 ENCOUNTER — Inpatient Hospital Stay (HOSPITAL_COMMUNITY): Payer: BLUE CROSS/BLUE SHIELD | Admitting: Anesthesiology

## 2017-12-24 ENCOUNTER — Encounter (HOSPITAL_COMMUNITY): Payer: Self-pay | Admitting: *Deleted

## 2017-12-24 ENCOUNTER — Other Ambulatory Visit: Payer: Self-pay

## 2017-12-24 ENCOUNTER — Inpatient Hospital Stay (HOSPITAL_COMMUNITY)
Admission: RE | Admit: 2017-12-24 | Discharge: 2017-12-26 | DRG: 743 | Disposition: A | Discharge status: Home / Self Care | Payer: BLUE CROSS/BLUE SHIELD | Attending: Obstetrics and Gynecology | Admitting: Obstetrics and Gynecology

## 2017-12-24 ENCOUNTER — Encounter (HOSPITAL_COMMUNITY)
Admission: RE | Disposition: A | Discharge status: Home / Self Care | Payer: Self-pay | Source: Home / Self Care | Attending: Obstetrics and Gynecology

## 2017-12-24 DIAGNOSIS — D270 Benign neoplasm of right ovary: Secondary | ICD-10-CM | POA: Diagnosis present

## 2017-12-24 DIAGNOSIS — D259 Leiomyoma of uterus, unspecified: Secondary | ICD-10-CM | POA: Diagnosis not present

## 2017-12-24 DIAGNOSIS — R1030 Lower abdominal pain, unspecified: Secondary | ICD-10-CM

## 2017-12-24 DIAGNOSIS — Z90711 Acquired absence of uterus with remaining cervical stump: Secondary | ICD-10-CM | POA: Diagnosis present

## 2017-12-24 DIAGNOSIS — N736 Female pelvic peritoneal adhesions (postinfective): Secondary | ICD-10-CM | POA: Diagnosis not present

## 2017-12-24 DIAGNOSIS — N994 Postprocedural pelvic peritoneal adhesions: Secondary | ICD-10-CM

## 2017-12-24 DIAGNOSIS — Z79899 Other long term (current) drug therapy: Secondary | ICD-10-CM | POA: Diagnosis not present

## 2017-12-24 DIAGNOSIS — D279 Benign neoplasm of unspecified ovary: Secondary | ICD-10-CM | POA: Diagnosis present

## 2017-12-24 DIAGNOSIS — N920 Excessive and frequent menstruation with regular cycle: Secondary | ICD-10-CM | POA: Diagnosis not present

## 2017-12-24 DIAGNOSIS — Z791 Long term (current) use of non-steroidal anti-inflammatories (NSAID): Secondary | ICD-10-CM | POA: Diagnosis not present

## 2017-12-24 HISTORY — PX: SALPINGOOPHORECTOMY: SHX82

## 2017-12-24 HISTORY — PX: SUPRACERVICAL ABDOMINAL HYSTERECTOMY: SHX5393

## 2017-12-24 LAB — CREATININE, SERUM
Creatinine, Ser: 0.89 mg/dL (ref 0.44–1.00)
GFR calc Af Amer: >60 mL/min (ref >60)
GFR calc non Af Amer: >60 mL/min (ref >60)

## 2017-12-24 LAB — CBC
HCT: 42 % (ref 36.0–46.0)
Hemoglobin: 13.4 g/dL (ref 12.0–15.0)
MCH: 29.6 pg (ref 26.0–34.0)
MCHC: 31.9 g/dL (ref 30.0–36.0)
MCV: 92.7 fL (ref 78.0–100.0)
Platelets: 324 10*3/uL (ref 150–400)
RBC: 4.53 MIL/uL (ref 3.87–5.11)
RDW: 15 % (ref 11.5–15.5)
WBC: 16 10*3/uL — ABNORMAL HIGH (ref 4.0–10.5)

## 2017-12-24 SURGERY — HYSTERECTOMY, SUPRACERVICAL, ABDOMINAL
Anesthesia: General | Site: Abdomen | Laterality: Right

## 2017-12-24 MED ORDER — ONDANSETRON HCL 4 MG/2ML IJ SOLN
INTRAMUSCULAR | Status: AC
  Filled 2017-12-24: qty 2

## 2017-12-24 MED ORDER — PHENYLEPHRINE 40 MCG/ML (10ML) SYRINGE FOR IV PUSH (FOR BLOOD PRESSURE SUPPORT)
PREFILLED_SYRINGE | INTRAVENOUS | Status: AC
  Filled 2017-12-24: qty 10

## 2017-12-24 MED ORDER — EPHEDRINE SULFATE 50 MG/ML IJ SOLN
INTRAMUSCULAR | Status: DC | PRN
  Administered 2017-12-24: 10 mg via INTRAVENOUS

## 2017-12-24 MED ORDER — LACTATED RINGERS IV SOLN
INTRAVENOUS | Status: DC | PRN
  Administered 2017-12-24 (×3): via INTRAVENOUS

## 2017-12-24 MED ORDER — ROPINIROLE HCL 1 MG PO TABS
1.0000 mg | ORAL_TABLET | Freq: Every day | ORAL | Status: DC
  Administered 2017-12-25 (×2): 1 mg via ORAL
  Filled 2017-12-24 (×2): qty 1

## 2017-12-24 MED ORDER — DEXAMETHASONE SODIUM PHOSPHATE 4 MG/ML IJ SOLN
INTRAMUSCULAR | Status: DC | PRN
  Administered 2017-12-24: 4 mg via INTRAVENOUS

## 2017-12-24 MED ORDER — ONDANSETRON HCL 4 MG PO TABS: 4.0000 mg | ORAL_TABLET | Freq: Four times a day (QID) | ORAL | Status: DC | PRN

## 2017-12-24 MED ORDER — LIDOCAINE HCL 1 % IJ SOLN
INTRAMUSCULAR | Status: DC | PRN
  Administered 2017-12-24: 25 mg via INTRADERMAL

## 2017-12-24 MED ORDER — MEPERIDINE HCL 50 MG/ML IJ SOLN: 6.2500 mg | INTRAMUSCULAR | Status: DC | PRN

## 2017-12-24 MED ORDER — PANTOPRAZOLE SODIUM 40 MG PO TBEC
40.0000 mg | DELAYED_RELEASE_TABLET | Freq: Every day | ORAL | Status: DC
  Administered 2017-12-25 – 2017-12-26 (×2): 40 mg via ORAL
  Filled 2017-12-24 (×2): qty 1

## 2017-12-24 MED ORDER — SODIUM CHLORIDE 0.9 % IV SOLN
INTRAVENOUS | Status: DC
  Administered 2017-12-24 – 2017-12-25 (×3): via INTRAVENOUS

## 2017-12-24 MED ORDER — 0.9 % SODIUM CHLORIDE (POUR BTL) OPTIME
TOPICAL | Status: DC | PRN
  Administered 2017-12-24: 2000 mL

## 2017-12-24 MED ORDER — BUPIVACAINE HCL (PF) 0.5 % IJ SOLN
INTRAMUSCULAR | Status: AC
  Filled 2017-12-24: qty 30

## 2017-12-24 MED ORDER — NALOXONE HCL 0.4 MG/ML IJ SOLN: 0.4000 mg | INTRAMUSCULAR | Status: DC | PRN

## 2017-12-24 MED ORDER — HYDROMORPHONE HCL 1 MG/ML IJ SOLN
0.2500 mg | INTRAMUSCULAR | Status: DC | PRN
  Administered 2017-12-24 (×2): 0.5 mg via INTRAVENOUS

## 2017-12-24 MED ORDER — MIDAZOLAM HCL 5 MG/5ML IJ SOLN
INTRAMUSCULAR | Status: DC | PRN
  Administered 2017-12-24: 2 mg via INTRAVENOUS

## 2017-12-24 MED ORDER — KETOROLAC TROMETHAMINE 30 MG/ML IJ SOLN
30.0000 mg | Freq: Once | INTRAMUSCULAR | Status: AC | PRN
  Administered 2017-12-24: 30 mg via INTRAVENOUS

## 2017-12-24 MED ORDER — FENTANYL CITRATE (PF) 250 MCG/5ML IJ SOLN
INTRAMUSCULAR | Status: AC
  Filled 2017-12-24: qty 5

## 2017-12-24 MED ORDER — ROCURONIUM BROMIDE 50 MG/5ML IV SOLN
INTRAVENOUS | Status: AC
  Filled 2017-12-24: qty 1

## 2017-12-24 MED ORDER — DEXAMETHASONE SODIUM PHOSPHATE 4 MG/ML IJ SOLN
INTRAMUSCULAR | Status: AC
  Filled 2017-12-24: qty 1

## 2017-12-24 MED ORDER — EPHEDRINE SULFATE 50 MG/ML IJ SOLN
INTRAMUSCULAR | Status: AC
  Filled 2017-12-24: qty 1

## 2017-12-24 MED ORDER — ONDANSETRON HCL 4 MG/2ML IJ SOLN: 4.0000 mg | Freq: Four times a day (QID) | INTRAMUSCULAR | Status: DC | PRN

## 2017-12-24 MED ORDER — FENTANYL CITRATE (PF) 100 MCG/2ML IJ SOLN
INTRAMUSCULAR | Status: DC | PRN
  Administered 2017-12-24 (×5): 50 ug via INTRAVENOUS

## 2017-12-24 MED ORDER — KETOROLAC TROMETHAMINE 30 MG/ML IJ SOLN
30.0000 mg | Freq: Four times a day (QID) | INTRAMUSCULAR | Status: DC
  Administered 2017-12-24 – 2017-12-26 (×8): 30 mg via INTRAVENOUS
  Filled 2017-12-24 (×8): qty 1

## 2017-12-24 MED ORDER — CEFAZOLIN SODIUM-DEXTROSE 2-4 GM/100ML-% IV SOLN
2.0000 g | INTRAVENOUS | Status: AC
  Administered 2017-12-24: 2 g via INTRAVENOUS
  Filled 2017-12-24: qty 100

## 2017-12-24 MED ORDER — MIDAZOLAM HCL 2 MG/2ML IJ SOLN
INTRAMUSCULAR | Status: AC
  Filled 2017-12-24: qty 2

## 2017-12-24 MED ORDER — LACTATED RINGERS IV SOLN: INTRAVENOUS | Status: DC

## 2017-12-24 MED ORDER — KETOROLAC TROMETHAMINE 30 MG/ML IJ SOLN: 30.0000 mg | Freq: Four times a day (QID) | INTRAMUSCULAR | Status: DC

## 2017-12-24 MED ORDER — SUCCINYLCHOLINE CHLORIDE 20 MG/ML IJ SOLN
INTRAMUSCULAR | Status: AC
  Filled 2017-12-24: qty 1

## 2017-12-24 MED ORDER — HYDROMORPHONE 1 MG/ML IV SOLN
INTRAVENOUS | Status: AC
Start: 2017-12-24 — End: ?
  Filled 2017-12-24: qty 25

## 2017-12-24 MED ORDER — ONDANSETRON HCL 4 MG/2ML IJ SOLN: 4.0000 mg | Freq: Once | INTRAMUSCULAR | Status: DC | PRN

## 2017-12-24 MED ORDER — HYDROCODONE-ACETAMINOPHEN 7.5-325 MG PO TABS: 1.0000 | ORAL_TABLET | Freq: Once | ORAL | Status: DC | PRN

## 2017-12-24 MED ORDER — SODIUM CHLORIDE 0.9% FLUSH: 9.0000 mL | INTRAVENOUS | Status: DC | PRN

## 2017-12-24 MED ORDER — PROPOFOL 10 MG/ML IV BOLUS
INTRAVENOUS | Status: DC | PRN
  Administered 2017-12-24: 150 mg via INTRAVENOUS

## 2017-12-24 MED ORDER — HYDROMORPHONE 1 MG/ML IV SOLN
INTRAVENOUS | Status: DC
  Administered 2017-12-24: 0.3 mg via INTRAVENOUS
  Administered 2017-12-24: 12:00:00 via INTRAVENOUS
  Administered 2017-12-25: 2.1 mg via INTRAVENOUS

## 2017-12-24 MED ORDER — BUPIVACAINE HCL (PF) 0.5 % IJ SOLN
INTRAMUSCULAR | Status: DC | PRN
  Administered 2017-12-24: 20 mL

## 2017-12-24 MED ORDER — ONDANSETRON HCL 4 MG/2ML IJ SOLN
INTRAMUSCULAR | Status: DC | PRN
  Administered 2017-12-24: 4 mg via INTRAVENOUS

## 2017-12-24 MED ORDER — ESMOLOL HCL 100 MG/10ML IV SOLN
INTRAVENOUS | Status: DC | PRN
  Administered 2017-12-24 (×2): 5 mg via INTRAVENOUS

## 2017-12-24 MED ORDER — ENOXAPARIN SODIUM 30 MG/0.3ML ~~LOC~~ SOLN
30.0000 mg | SUBCUTANEOUS | Status: DC
  Administered 2017-12-25 – 2017-12-26 (×2): 30 mg via SUBCUTANEOUS
  Filled 2017-12-24 (×2): qty 0.3

## 2017-12-24 MED ORDER — SUGAMMADEX SODIUM 500 MG/5ML IV SOLN
INTRAVENOUS | Status: AC
  Filled 2017-12-24: qty 5

## 2017-12-24 MED ORDER — HYDROMORPHONE HCL 1 MG/ML IJ SOLN
INTRAMUSCULAR | Status: AC
  Filled 2017-12-24: qty 0.5

## 2017-12-24 MED ORDER — DIPHENHYDRAMINE HCL 50 MG/ML IJ SOLN: 12.5000 mg | Freq: Four times a day (QID) | INTRAMUSCULAR | Status: DC | PRN

## 2017-12-24 MED ORDER — KETOROLAC TROMETHAMINE 30 MG/ML IJ SOLN
30.0000 mg | Freq: Once | INTRAMUSCULAR | Status: DC
  Filled 2017-12-24: qty 1

## 2017-12-24 MED ORDER — SUGAMMADEX SODIUM 500 MG/5ML IV SOLN
INTRAVENOUS | Status: DC | PRN
  Administered 2017-12-24: 200 mg via INTRAVENOUS

## 2017-12-24 MED ORDER — SODIUM CHLORIDE 0.9 % IJ SOLN
INTRAMUSCULAR | Status: AC
  Filled 2017-12-24: qty 10

## 2017-12-24 MED ORDER — ESMOLOL HCL 100 MG/10ML IV SOLN
INTRAVENOUS | Status: AC
  Filled 2017-12-24: qty 10

## 2017-12-24 MED ORDER — FENTANYL CITRATE (PF) 100 MCG/2ML IJ SOLN
INTRAMUSCULAR | Status: AC
  Filled 2017-12-24: qty 2

## 2017-12-24 MED ORDER — LIDOCAINE HCL (PF) 1 % IJ SOLN
INTRAMUSCULAR | Status: AC
  Filled 2017-12-24: qty 5

## 2017-12-24 MED ORDER — PROPOFOL 10 MG/ML IV BOLUS
INTRAVENOUS | Status: AC
  Filled 2017-12-24: qty 40

## 2017-12-24 MED ORDER — IBUPROFEN 600 MG PO TABS
600.0000 mg | ORAL_TABLET | Freq: Four times a day (QID) | ORAL | Status: DC | PRN
  Administered 2017-12-25: 600 mg via ORAL
  Filled 2017-12-24: qty 1

## 2017-12-24 MED ORDER — SIMVASTATIN 20 MG PO TABS
20.0000 mg | ORAL_TABLET | Freq: Every day | ORAL | Status: DC
  Administered 2017-12-25 (×2): 20 mg via ORAL
  Filled 2017-12-24 (×2): qty 1

## 2017-12-24 MED ORDER — ROCURONIUM BROMIDE 100 MG/10ML IV SOLN
INTRAVENOUS | Status: DC | PRN
  Administered 2017-12-24: 50 mg via INTRAVENOUS
  Administered 2017-12-24: 10 mg via INTRAVENOUS

## 2017-12-24 MED ORDER — DIPHENHYDRAMINE HCL 12.5 MG/5ML PO ELIX: 12.5000 mg | ORAL_SOLUTION | Freq: Four times a day (QID) | ORAL | Status: DC | PRN

## 2017-12-24 SURGICAL SUPPLY — 53 items
BENZOIN TINCTURE PRP APPL 2/3 (GAUZE/BANDAGES/DRESSINGS) ×9 IMPLANT
BLADE SURG SZ10 CARB STEEL (BLADE) ×3 IMPLANT
CLOTH BEACON ORANGE TIMEOUT ST (SAFETY) ×3 IMPLANT
COVER LIGHT HANDLE STERIS (MISCELLANEOUS) ×6 IMPLANT
DECANTER SPIKE VIAL GLASS SM (MISCELLANEOUS) ×3 IMPLANT
DRAPE WARM FLUID 44X44 (DRAPE) ×3 IMPLANT
DRSG OPSITE POSTOP 4X10 (GAUZE/BANDAGES/DRESSINGS) ×3 IMPLANT
DURAPREP 26ML APPLICATOR (WOUND CARE) ×3 IMPLANT
ELECT REM PT RETURN 9FT ADLT (ELECTROSURGICAL) ×3
ELECTRODE REM PT RTRN 9FT ADLT (ELECTROSURGICAL) ×2 IMPLANT
EVACUATOR DRAINAGE 10X20 100CC (DRAIN) ×2 IMPLANT
EVACUATOR SILICONE 100CC (DRAIN) ×1
GAUZE SPONGE 4X4 12PLY STRL (GAUZE/BANDAGES/DRESSINGS) ×3 IMPLANT
GLOVE BIOGEL PI IND STRL 7.0 (GLOVE) ×8 IMPLANT
GLOVE BIOGEL PI IND STRL 8 (GLOVE) ×2 IMPLANT
GLOVE BIOGEL PI IND STRL 9 (GLOVE) ×2 IMPLANT
GLOVE BIOGEL PI INDICATOR 7.0 (GLOVE) ×4
GLOVE BIOGEL PI INDICATOR 8 (GLOVE) ×1
GLOVE BIOGEL PI INDICATOR 9 (GLOVE) ×1
GLOVE ECLIPSE 6.5 STRL STRAW (GLOVE) ×9 IMPLANT
GLOVE ECLIPSE 8.0 STRL XLNG CF (GLOVE) ×3 IMPLANT
GLOVE ECLIPSE 9.0 STRL (GLOVE) ×6 IMPLANT
GOWN SPEC L3 XXLG W/TWL (GOWN DISPOSABLE) ×3 IMPLANT
GOWN STRL REUS W/TWL LRG LVL3 (GOWN DISPOSABLE) ×6 IMPLANT
GOWN STRL REUS W/TWL XL LVL3 (GOWN DISPOSABLE) ×3 IMPLANT
INST SET MAJOR GENERAL (KITS) ×3 IMPLANT
KIT TURNOVER KIT A (KITS) ×3 IMPLANT
MANIFOLD NEPTUNE II (INSTRUMENTS) ×3 IMPLANT
NEEDLE HYPO 25X1 1.5 SAFETY (NEEDLE) ×3 IMPLANT
NS IRRIG 1000ML POUR BTL (IV SOLUTION) ×6 IMPLANT
PACK ABDOMINAL MAJOR (CUSTOM PROCEDURE TRAY) ×3 IMPLANT
PAD ARMBOARD 7.5X6 YLW CONV (MISCELLANEOUS) ×3 IMPLANT
SET BASIN LINEN APH (SET/KITS/TRAYS/PACK) ×3 IMPLANT
SPONGE DRAIN TRACH 4X4 STRL 2S (GAUZE/BANDAGES/DRESSINGS) ×3 IMPLANT
SPONGE INTESTINAL PEANUT (DISPOSABLE) ×3 IMPLANT
SPONGE LAP 18X18 X RAY DECT (DISPOSABLE) ×6 IMPLANT
STRIP CLOSURE SKIN 1/2X4 (GAUZE/BANDAGES/DRESSINGS) ×9 IMPLANT
SUT CHROMIC 0 CT 1 (SUTURE) ×24 IMPLANT
SUT CHROMIC 2 0 CT 1 (SUTURE) ×9 IMPLANT
SUT ETHILON 3 0 FSL (SUTURE) ×3 IMPLANT
SUT PDS AB CT VIOLET #0 27IN (SUTURE) ×6 IMPLANT
SUT PLAIN CT 1/2CIR 2-0 27IN (SUTURE) ×15 IMPLANT
SUT PROLENE 0 CT 1 30 (SUTURE) IMPLANT
SUT VIC AB 0 CT1 27 (SUTURE) ×3
SUT VIC AB 0 CT1 27XBRD ANTBC (SUTURE) IMPLANT
SUT VIC AB 0 CT1 27XCR 8 STRN (SUTURE) ×6 IMPLANT
SUT VICRYL 4 0 KS 27 (SUTURE) ×6 IMPLANT
SUT VICRYL AB 2 0 TIES (SUTURE) ×3 IMPLANT
SYR BULB IRRIGATION 50ML (SYRINGE) ×3 IMPLANT
SYR CONTROL 10ML LL (SYRINGE) ×3 IMPLANT
TOWEL BLUE STERILE X RAY DET (MISCELLANEOUS) ×3 IMPLANT
TOWEL OR 17X26 4PK STRL BLUE (TOWEL DISPOSABLE) ×3 IMPLANT
TRAY FOLEY METER SIL LF 16FR (CATHETERS) ×3 IMPLANT

## 2017-12-24 NOTE — Progress Notes (Signed)
Received verbal order from Dr. Glo Herring to place patient on Full liquid diet.

## 2017-12-24 NOTE — Op Note (Signed)
12/24/2017  10:46 AM  PATIENT:  Chloe Orr  42 y.o. female  PRE-OPERATIVE DIAGNOSIS:  Fibroids Menorrhagia Right Ovarian Dermoid Cyst   POST-OPERATIVE DIAGNOSIS:  Fibroids Menorrhagia Right Ovarian Dermoid Cyst Postoperative pelvic adhesions  PROCEDURE:  Procedure(s): HYSTERECTOMY SUPRACERVICAL ABDOMINAL (N/A) RIGHT SALPINGO OOPHORECTOMY (Right)  SURGEON:  Surgeon(s) and Role:    [name], MD - Primary    * Florian Buff, MD - Assisting An experienced assistant was required during the critical portions of hysterectomy due to the extensive adhesions and technical difficulty being encountered.  This assistant was needed for exposure, dissection, suctioning, retraction, instrument exchange, assisting with delivery with administration of fundal pressure, and for overall help during the procedure.  PHYSICIAN ASSISTAnt   Eure, MD  ASSISTANTS: Corrie Dandy, RNFA  ANESTHESIA:   local and general  EBL:  400 mL   BLOOD ADMINISTERED:none  DRAINS: (10 mm single) Jackson-Pratt drain(s) with closed bulb suction in the Subcutaneous space and Urinary Catheter (Foley)   LOCAL MEDICATIONS USED:  MARCAINE    and Amount: 20 ml subcutaneous  SPECIMEN:  Source of Specimen:  , Uterus with portion of lower uterine segment, right tube and ovary,  DISPOSITION OF SPECIMEN:  PATHOLOGY  COUNTS:  YES  TOURNIQUET:  * No tourniquets in log *  DICTATION: .Dragon Dictation  PLAN OF CARE: Admit to inpatient   PATIENT DISPOSITION:  PACU - hemodynamically stable.   Delay start of Pharmacological VTE agent (>24hrs) due to surgical blood loss or risk of bleeding: yes we will begin at 24 hours Findings: Extensive adhesions of the omentum to the anterior abdominal wall and to the fundus of the uterus.  The adhesions were also to the left adnexa and to the right ovary requiring adhesio lysis for significant portion of the case The large fibroid uterus, estimated at 1500 g was difficult  technically difficult to extricate due to dense firm adhesions and during critical portion of the case.ER Dr. Darlis Loan was called into assist with the case  Details of procedure: Patient was taken the operating prepped and draped for lower abdominal surgery with timeout conducted Foley catheter in place with Ancef administered and procedure confirmed by surgical team.  Hemoglobin is 13.  Transverse lower abdominal incision was made with wide excision of the old cicatrix.  We included the old C-section scar.  The total length of the excision of the old cicatrix was 35 cm in length by approximately 10 cm in width at its maximum midline width, the midline lower abdominal area was entered, and surgical technique of Pelosi incision.  Extensive omental adhesions to the anterior abdominal wall were encountered and were dissected sufficiently to restore normal anatomy and identified that the uterus was densely adherent in the bladder flap area.  The densest adhesion was approximately 1 cm in diameter was to the left of the midline to the anterior abdominal wall and was clamped cut and suture-ligated being careful to stay close to the uterus and well away from the musculature of the anterior peritoneal wall.  It was just in front of the left round ligament.  This was taken down with level Kelly clamps, with transection and 0 Vicryl ligature thin adhesions were dissected free sufficiently that the Balfour retractor could be placed in the abdominal wall incision to improve exposure.  Gradual restoration of normal anatomy was performed with only moderate blood loss during this portion of the case.  Once the round ligament on the left could be identified  it was doubly clamped cut and suture-ligated.  The left utero-ovarian ligament could be isolated doubly clamped cut and suture-ligated with 0 Vicryl as well the uterus was became slightly mobile at this point we then could address the right side.  Due to the density of the  adhesions and the technical difficulties.here was asked to step in to assist with the critical parts of the case in the adhesio lysis and control of uterine vessels which were large and extensive.  We proceeded with the exposure of the right round ligament which was doubly clamped cut and suture-ligated and identification of the right utero-ovarian pedicle which was doubly clamped cut and suture-ligated.  At this time with Dr. years help we were able to exteriorize the uterus and this allowed dramatically improved access to the lateral uterine vessels.  Some additional attention was performed to free up the lower uterine segment from any adhesions from the old C-sections entry site, then uterine vessels were doubly clamped on each side with curved Heaney clamp, transected on the left, ligated with 0 Vicryl, then straight Heaney clamp placed across the remaining portions of the upper cardinal ligaments which were clamped cut and suture-ligated on the left side..  On the patient's right side, I was able to doubly clamp the uterine vessels, transected, doubly ligated with 0 Vicryl, then amputate the uterus off of the cervix with good hemostasis.  A coring out procedure was performed to allow the lower uterine segment stump to be imbricated and pulled front to back on itself.  This resulted in good tissue approximation and minimize the raw surfaces that might attract adhesions, and then hemostasis was considered satisfactory.  Pelvis was irrigated.  EBL was between 3 and 400 cc at this point. Right ovary dermoid was then addressed.  The omental adhesions covering the superior portions of the dermoid cyst were then freed up and the ovary inspected.  Is a large 10 cm ovary and we identified a cleavage plane that we thought represented the remnants of normal tissue with a left about 4 cm the ovary.  As we dissected through this cleavage plane it became apparent that there were multiple areas of dermoid tissue on both  sides of the cleavage plane so the ovary was felt to be not salvageable.  The right infundibulopelvic ligament was well exposed at this point and could be doubly ligated just beneath the ovary and the ovary amputated and spit taken out as specimen.  Pedicles hemostatic.  Attention was then directed to the left side.  Adhesions were freed up but the ovary was considered salvageable.  The ovary was quite firm and hard but there was no identifiable dermoid tissue on visual inspection and palpation   Pedicles were inspected for hemostasis pelvis irrigated.  The left adnexa was reperitonealized superficially with interrupted 2-0 chromic sutures x2.  The lower uterine segment stump was oversewn with bladder flap edge pulled back and attached over the lower uterine segment.  This was done with 2 interrupted sutures of loose 2-0 chromic.  Pelvis was considered hemostatic.  Laparotomy tapes were removed.  And sponge and needle counts were correct.  The omentum was noted to be quite thick but was hemostatic after it was freed up and resting freely in the abdomen. With all equipment removed, we were reapproximated the peritoneum anteriorly with 2-0 chromic in a running fashion, then closed the fascial closure with 0 PDS in a continuous running fashion with good hemostasis we began it sutured each incised  in the mid portion of the incision.  Subcutaneous tissues were irrigated confirmed as hemostatic reapproximated using interrupted horizontal mattress sutures of 2 oh plain, then subcuticular 4-0 Vicryl used to close the skin incisions.  Should be noted that they have been a small 2 cm T type incision made in the midline of the upper aspects of the skin in order to improve pelvic access.  This was reapproximated with good tissue edge approximation.  Steri-Strips were applied and patient to recovery in stable condition with 400 cc estimated blood loss and good urine output.

## 2017-12-24 NOTE — Interval H&P Note (Signed)
History and Physical Interval Note:  12/24/2017 7:18 AM  Chloe Orr  has presented today for surgery, with the diagnosis of Fibroids Vaginal Bleeding Menorrhagia  The various methods of treatment have been discussed with the patient and family. After consideration of risks, benefits and other options for treatment, the patient has consented to  Procedure(s): HYSTERECTOMY SUPRACERVICAL ABDOMINAL (N/A) RIGHT SALPINGO OOPHORECTOMY as a surgical intervention . We have discussed again that she wants me to attempt to salvage any identifiable normal right ovarian tissue, so if cystectomy can be successfully accomplished without complete removal of the tube and ovary on the right, she wants that to be done. Additionally, we will go thru her old cesarean scar, with wide excision of the old scar, which has been marked as part of preop visit today, and pt confirms that we will place drains to remove any tendency for tissue fluid collection, as she had with her recent breast surgery. The right arm has been signed by pt and myself to document that the right ovary is the suspected side that the dermoid cyst is on.  The patient's history has been reviewed, patient examined, no change in status, stable for surgery.  I have reviewed the patient's chart and labs.  Questions were answered to the patient's satisfaction.   She had her last BM after the Mag  Citrate around 8:30 pm and had clear yellow urine this morning,indicating adequate hydration.  [name]

## 2017-12-24 NOTE — Anesthesia Procedure Notes (Signed)
Procedure Name: Intubation Date/Time: 12/24/2017 7:41 AM Performed by: Charmaine Downs, CRNA Pre-anesthesia Checklist: Patient identified, Patient being monitored, Emergency Drugs available and Suction available Patient Re-evaluated:Patient Re-evaluated prior to induction Oxygen Delivery Method: Circle System Utilized Preoxygenation: Pre-oxygenation with 100% oxygen Induction Type: IV induction Ventilation: Mask ventilation without difficulty and Oral airway inserted - appropriate to patient size Laryngoscope Size: Mac and 4 Grade View: Grade II Tube type: Oral Tube size: 7.0 mm Number of attempts: 1 Airway Equipment and Method: stylet Placement Confirmation: ETT inserted through vocal cords under direct vision,  positive ETCO2 and breath sounds checked- equal and bilateral Secured at: 23 cm Tube secured with: Tape Dental Injury: Teeth and Oropharynx as per pre-operative assessment

## 2017-12-24 NOTE — Anesthesia Preprocedure Evaluation (Signed)
Anesthesia Evaluation  Patient identified by MRN, date of birth, ID band Patient awake    Reviewed: Allergy & Precautions, H&P , NPO status , Patient's Chart, lab work & pertinent test results  Airway Mallampati: II  TM Distance: >3 FB Neck ROM: full    Dental no notable dental hx.    Pulmonary neg pulmonary ROS,    Pulmonary exam normal breath sounds clear to auscultation       Cardiovascular Exercise Tolerance: Good negative cardio ROS   Rhythm:regular Rate:Normal     Neuro/Psych negative neurological ROS  negative psych ROS   GI/Hepatic negative GI ROS, Neg liver ROS,   Endo/Other  negative endocrine ROS  Renal/GU negative Renal ROS  negative genitourinary   Musculoskeletal   Abdominal   Peds  Hematology negative hematology ROS (+)   Anesthesia Other Findings   Reproductive/Obstetrics negative OB ROS                             Anesthesia Physical Anesthesia Plan  ASA: II  Anesthesia Plan: General   Post-op Pain Management:    Induction:   PONV Risk Score and Plan:   Airway Management Planned:   Additional Equipment:   Intra-op Plan:   Post-operative Plan:   Informed Consent: I have reviewed the patients History and Physical, chart, labs and discussed the procedure including the risks, benefits and alternatives for the proposed anesthesia with the patient or authorized representative who has indicated his/her understanding and acceptance.   Dental Advisory Given  Plan Discussed with: CRNA  Anesthesia Plan Comments:         Anesthesia Quick Evaluation

## 2017-12-24 NOTE — Anesthesia Postprocedure Evaluation (Signed)
Anesthesia Post Note  Patient: Chloe Orr  Procedure(s) Performed: HYSTERECTOMY SUPRACERVICAL ABDOMINAL (N/A Abdomen) RIGHT SALPINGO OOPHORECTOMY (Right Abdomen)  Patient location during evaluation: PACU Anesthesia Type: General Level of consciousness: sedated and patient cooperative Pain management: pain level controlled Vital Signs Assessment: post-procedure vital signs reviewed and stable Respiratory status: spontaneous breathing, nonlabored ventilation and respiratory function stable Cardiovascular status: blood pressure returned to baseline Postop Assessment: no apparent nausea or vomiting Anesthetic complications: no     Last Vitals:  Vitals:   12/24/17 0719 12/24/17 1045  BP: 115/74 (!) 145/95  Pulse: 94 (!) 107  Resp: 18 (!) 22  Temp: 36.7 C 36.6 C  SpO2: 98% 100%    Last Pain:  Vitals:   12/24/17 1045  TempSrc:   PainSc: 5                  [name]

## 2017-12-24 NOTE — Transfer of Care (Signed)
Immediate Anesthesia Transfer of Care Note  Patient: Chloe Orr  Procedure(s) Performed: HYSTERECTOMY SUPRACERVICAL ABDOMINAL (N/A Abdomen) RIGHT SALPINGO OOPHORECTOMY (Right Abdomen)  Patient Location: PACU  Anesthesia Type:General  Level of Consciousness: drowsy and patient cooperative  Airway & Oxygen Therapy: Patient Spontanous Breathing and Patient connected to face mask oxygen  Post-op Assessment: Report given to RN, Post -op Vital signs reviewed and stable and Patient moving all extremities  Post vital signs: Reviewed and stable  Last Vitals:  Vitals Value Taken Time  BP    Temp    Pulse    Resp    SpO2      Last Pain:  Vitals:   12/24/17 0719  TempSrc: Oral  PainSc: 0-No pain      Patients Stated Pain Goal: 5 (89/78/[id number])  Complications: No apparent anesthesia complications

## 2017-12-25 ENCOUNTER — Encounter (HOSPITAL_COMMUNITY): Payer: Self-pay | Admitting: Obstetrics and Gynecology

## 2017-12-25 ENCOUNTER — Encounter: Payer: BLUE CROSS/BLUE SHIELD | Admitting: Obstetrics and Gynecology

## 2017-12-25 LAB — BASIC METABOLIC PANEL
Anion gap: 5 (ref 5–15)
BUN: 13 mg/dL (ref 6–20)
CO2: 24 mmol/L (ref 22–32)
Calcium: 8.2 mg/dL — ABNORMAL LOW (ref 8.9–10.3)
Chloride: 109 mmol/L (ref 101–111)
Creatinine, Ser: 0.88 mg/dL (ref 0.44–1.00)
GFR calc Af Amer: >60 mL/min (ref >60)
GFR calc non Af Amer: >60 mL/min (ref >60)
Glucose, Bld: 124 mg/dL — ABNORMAL HIGH (ref 65–99)
Potassium: 4.1 mmol/L (ref 3.5–5.1)
Sodium: 138 mmol/L (ref 135–145)

## 2017-12-25 LAB — CBC
HCT: 36 % (ref 36.0–46.0)
Hemoglobin: 11.1 g/dL — ABNORMAL LOW (ref 12.0–15.0)
MCH: 29 pg (ref 26.0–34.0)
MCHC: 30.8 g/dL (ref 30.0–36.0)
MCV: 94 fL (ref 78.0–100.0)
Platelets: 284 10*3/uL (ref 150–400)
RBC: 3.83 MIL/uL — ABNORMAL LOW (ref 3.87–5.11)
RDW: 15.1 % (ref 11.5–15.5)
WBC: 11.1 10*3/uL — ABNORMAL HIGH (ref 4.0–10.5)

## 2017-12-25 MED ORDER — OXYCODONE-ACETAMINOPHEN 5-325 MG PO TABS
1.0000 | ORAL_TABLET | ORAL | Status: DC | PRN
Start: 2017-12-25 — End: 2017-12-26
  Administered 2017-12-25 – 2017-12-26 (×3): 2 via ORAL
  Filled 2017-12-25 (×3): qty 2

## 2017-12-25 MED ORDER — IBUPROFEN 600 MG PO TABS: 600.0000 mg | ORAL_TABLET | Freq: Four times a day (QID) | ORAL | Status: DC | PRN

## 2017-12-25 NOTE — Progress Notes (Signed)
Pt requesting something for pain. Pt states she has been walking around and now her stomach hurts. Would like something in addition to the Toradol.

## 2017-12-25 NOTE — Progress Notes (Signed)
1 Day Post-Op Procedure(s) (LRB): HYSTERECTOMY SUPRACERVICAL ABDOMINAL (N/A) RIGHT SALPINGO OOPHORECTOMY (Right)  Subjective: Patient reports incisional pain and tolerating PO.  Foley catheter has not been removed will be removed shortly taking p.o. liquids and had family bring in some [place] vegetable soup last night which she tolerated.  She is hungry  Objective: I have reviewed patient's vital signs, medications and labs.  General: alert, cooperative and no distress Resp: clear to auscultation bilaterally GI: soft, non-tender; bowel sounds normal; no masses,  no organomegaly, normal findings: bowel sounds normal and incision: clean, dry, intact and Clear fluid in the JP drain Extremities: extremities normal, atraumatic, no cyanosis or edema and Homans sign is negative, no sign of DVT Vaginal Bleeding: minimal CBC Latest Ref Rng & Units 12/25/2017 12/24/2017 12/19/2017  WBC 4.0 - 10.5 K/uL 11.1(H) 16.0(H) 9.1  Hemoglobin 12.0 - 15.0 g/dL 11.1(L) 13.4 13.0  Hematocrit 36.0 - 46.0 % 36.0 42.0 41.3  Platelets 150 - 400 K/uL 284 324 265    BMP Latest Ref Rng & Units 12/25/2017 12/24/2017 12/19/2017  Glucose 65 - 99 mg/dL 124(H) - 102(H)  BUN 6 - 20 mg/dL 13 - 11  Creatinine 0.44 - 1.00 mg/dL 0.88 0.89 0.91  Sodium 135 - 145 mmol/L 138 - 138  Potassium 3.5 - 5.1 mmol/L 4.1 - 4.2  Chloride 101 - 111 mmol/L 109 - 106  CO2 22 - 32 mmol/L 24 - 24  Calcium 8.9 - 10.3 mg/dL 8.2(L) - 9.2    Assessment: s/p Procedure(s): HYSTERECTOMY SUPRACERVICAL ABDOMINAL (N/A) RIGHT SALPINGO OOPHORECTOMY (Right): stable, progressing well and tolerating diet  Plan: Advance diet Encourage ambulation Advance to PO medication Discontinue IV fluids  LOS: 1 day    [name] 12/25/2017, 8:22 AM

## 2017-12-25 NOTE — Addendum Note (Signed)
Addendum  created 12/25/17 1132 by Vista Deck, CRNA   Intraprocedure Staff edited

## 2017-12-26 MED ORDER — HYDROCHLOROTHIAZIDE 25 MG PO TABS: 25.0000 mg | ORAL_TABLET | Freq: Every day | ORAL | 0 refills | Status: DC

## 2017-12-26 MED ORDER — DOCUSATE SODIUM 100 MG PO CAPS: 100.0000 mg | ORAL_CAPSULE | Freq: Two times a day (BID) | ORAL | 99 refills | Status: DC

## 2017-12-26 MED ORDER — IBUPROFEN 600 MG PO TABS: 600.0000 mg | ORAL_TABLET | Freq: Four times a day (QID) | ORAL | 0 refills | Status: DC | PRN

## 2017-12-26 MED ORDER — OXYCODONE-ACETAMINOPHEN 5-325 MG PO TABS: 1.0000 | ORAL_TABLET | ORAL | 0 refills | Status: DC | PRN

## 2017-12-26 NOTE — Discharge Instructions (Signed)
Abdominal Hysterectomy °Abdominal hysterectomy is a surgical procedure to remove the womb (uterus). The uterus is the muscular organ that houses a developing baby. This surgery may be done if: °· You have cancer. °· You have growths (tumors or fibroids) in the uterus. °· You have long-term (chronic) pain. °· You are bleeding. °· Your uterus has slipped down into your vagina (uterine prolapse). °· You have a condition in which the tissue that lines the uterus grows outside of its normal location (endometriosis). °· You have an infection in your uterus. °· You are having problems with your menstrual cycle. ° °Depending on why you are having this procedure, you may also have other reproductive organs removed. These could include: °· The part of your vagina that connects with your uterus (cervix). °· The organs that make eggs (ovaries). °· The tubes that connect the ovaries to the uterus (fallopian tubes). ° °Tell a health care provider about: °· Any allergies you have. °· All medicines you are taking, including vitamins, herbs, eye drops, creams, and over-the-counter medicines. °· Any problems you or family members have had with anesthetic medicines. °· Any blood disorders you have. °· Any surgeries you have had. °· Any medical conditions you have. °· Whether you are pregnant or may be pregnant. °What are the risks? °Generally, this is a safe procedure. However, problems may occur, including: °· Bleeding. °· Infection. °· Allergic reactions to medicines or dyes. °· Damage to other structures or organs. °· Nerve injury. °· Decreased interest in sex or pain during sex. °· Blood clots that can break free and travel to your lungs. ° °What happens before the procedure? °Staying hydrated °Follow instructions from your health care provider about hydration, which may include: °· Up to 2 hours before the procedure - you may continue to drink clear liquids, such as water, clear fruit juice, black coffee, and plain tea ° °Eating  and drinking restrictions °Follow instructions from your health care provider about eating and drinking, which may include: °· 8 hours before the procedure - stop eating heavy meals or foods such as meat, fried foods, or fatty foods. °· 6 hours before the procedure - stop eating light meals or foods, such as toast or cereal. °· 6 hours before the procedure - stop drinking milk or drinks that contain milk. °· 2 hours before the procedure - stop drinking clear liquids. ° °Medicines °· Ask your health care provider about: °? Changing or stopping your regular medicines. This is especially important if you are taking diabetes medicines or blood thinners. °? Taking medicines such as aspirin and ibuprofen. These medicines can thin your blood. Do not take these medicines before your procedure if your health care provider instructs you not to. °· You may be given antibiotic medicine to help prevent infection. Take it as told by your health care provider. °· You may be asked to take laxatives to prevent constipation. °General instructions °· Ask your health care provider how your surgical site will be marked or identified. °· You may be asked to shower with a germ-killing soap. °· Plan to have someone take you home from the hospital. °· Do not use any products that contain nicotine or tobacco, such as cigarettes and e-cigarettes. If you need help quitting, ask your health care provider. °· You may have an exam or testing. °· You may have a blood or urine sample taken. °· You may need to have an enema to clean out your rectum and lower colon. °· This   procedure can affect the way you feel about yourself. Talk to your health care provider about the physical and emotional changes this procedure may cause. °What happens during the procedure? °· To lower your risk of infection: °? Your health care team will wash or sanitize their hands. °? Your skin will be washed with soap. °? Hair may be removed from the surgical area. °· An IV  tube will be inserted into one of your veins. °· You will be given one or more of the following: °? A medicine to help you relax (sedative). °? A medicine to make you fall asleep (general anesthetic). °· Tight-fitting (compression) stockings will be placed on your legs to promote circulation. °· A thin, flexible tube (catheter) will be inserted to help drain your urine. °· The surgeon will make a cut (incision) through the skin in your lower belly. The incision may go side-to-side or up-and-down. °· The surgeon will move aside the body tissue that covers your uterus. The surgeon will then carefully take out your uterus along with any of the other organs that need to be removed. °· Bleeding will be controlled with clamps or sutures. °· The surgeon will close your incision with stitches (sutures), skin glue, or adhesive strips. °· A bandage (dressing) will be placed over the incision. °The procedure may vary among health care providers and hospitals. °What happens after the procedure? °· You will be given pain medicine as needed. °· Your blood pressure, heart rate, breathing rate, and blood oxygen level will be monitored until the medicines you were given have worn off. °· You will need to stay in the hospital to recover for one to two days. Ask your health care provider how long you will need to stay in the hospital after your procedure. °· You may have a liquid diet at first. You will most likely return to your usual diet the day after surgery. °· You will still have the urinary catheter in place. It will likely be removed the day after surgery. °· You may have to wear compression stockings. These stockings help to prevent blood clots and reduce swelling in your legs. °· You will be encouraged to walk as soon as possible. You will also use a device or do breathing exercises to keep your lungs clear. °· You may need to use a sanitary napkin for vaginal discharge. °Summary °· Abdominal hysterectomy is a surgical  procedure to remove the womb (uterus). The uterus is the muscular organ that houses a developing baby. °· This procedure can affect the way you feel about yourself. Talk to your health care provider about the physical and emotional changes this procedure may cause. °· You will be given medicines for pain after the procedure. °· You will need to stay in the hospital to recover. Ask your health care provider how long you will need to stay in the hospital after your procedure. °This information is not intended to replace advice given to you by your health care provider. Make sure you discuss any questions you have with your health care provider. °Document Released: 07/14/2013 Document Revised: 06/27/2016 Document Reviewed: 06/27/2016 °[employer] Interactive Patient Education © 2017 [employer] ° °

## 2017-12-26 NOTE — Progress Notes (Signed)
Patient discharged home today per MD orders. Patient vital signs WDL. IV removed and site WDL. Discharge Instructions including follow up appointments, medications, and education reviewed with patient. Patient verbalizes understanding. Patient walked down with nursing staff.

## 2017-12-26 NOTE — Progress Notes (Signed)
17 ml dilaudid wasted from leftover PCA syringe - witnessed with Marissa Nestle RN and wasted in the sink

## 2017-12-26 NOTE — Discharge Summary (Signed)
Physician Discharge Summary  Patient ID: Chloe Orr MRN: [id number] DOB/AGE: 04-12-1976 42 y.o.  Admit date: 12/24/2017 Discharge date: 12/26/2017  Admission Diagnoses: Symptomatic uterine fibroids 16 to 20 weeks size menorrhagia abnormal bleeding  Discharge Diagnoses: Symptomatic uterine fibroid 16 to 20 weeks size menorrhagia abnormal bleeding Active Problems:   Lower abdominal pain   Postprocedural pelvic peritoneal adhesions   S/P subtotal hysterectomy   Discharged Condition: good Discharge medications: HCTZ, Linzess, Colace twice daily, Percocet for pain, patient instructed on how to empty the JP drain Hospital Course: 42 year old female was admitted and underwent abdominal supracervical hysterectomy, with right salpingo-oophorectomy with wide excision of the old cicatrix, and placement of a subcutaneous Jackson-Pratt drain after day surgery admission on 12/24/2017 EBL was 400 cc  Consults: gynecology doctor your was asked to assist me with the critical portion of the hysterectomy due to the technical difficulty of moving these huge fibroids with the extensive adhesions surrounding the uterus from her prior cesarean's  Significant Diagnostic Studies: labs:  CBC Latest Ref Rng & Units 12/25/2017 12/24/2017 12/19/2017  WBC 4.0 - 10.5 K/uL 11.1(H) 16.0(H) 9.1  Hemoglobin 12.0 - 15.0 g/dL 11.1(L) 13.4 13.0  Hematocrit 36.0 - 46.0 % 36.0 42.0 41.3  Platelets 150 - 400 K/uL 284 324 265   BMP Latest Ref Rng & Units 12/25/2017 12/24/2017 12/19/2017  Glucose 65 - 99 mg/dL 124(H) - 102(H)  BUN 6 - 20 mg/dL 13 - 11  Creatinine 0.44 - 1.00 mg/dL 0.88 0.89 0.91  Sodium 135 - 145 mmol/L 138 - 138  Potassium 3.5 - 5.1 mmol/L 4.1 - 4.2  Chloride 101 - 111 mmol/L 109 - 106  CO2 22 - 32 mmol/L 24 - 24  Calcium 8.9 - 10.3 mg/dL 8.2(L) - 9.2    Treatments: surgery: Abdominal supracervical hysterectomy right salpingo-oophorectomy, lysis of extensive pelvic and peritoneal adhesions  Discharge  Exam: Blood pressure 116/74, pulse 82, temperature 98.4 F (36.9 C), temperature source Oral, resp. rate 16, height 4\' 11"  (1.499 m), weight 186 lb 15.2 oz (84.8 kg), last menstrual period 12/19/2017, SpO2 99 %. General appearance: alert, cooperative and no distress Head: Normocephalic, without obvious abnormality, atraumatic GI: soft, non-tender; bowel sounds normal; no masses,  no organomegaly Extremities: extremities normal, atraumatic, no cyanosis or edema and Homans sign is negative, no sign of DVT Incision/Wound: Clean dry intact with clear serous fluid from her JP drain the patient complains of her hands were swollen and could not be appreciated clinically that there was a problem so she was given a diuretic upon discharge  Disposition:  Home  Allergies as of 12/26/2017   No Known Allergies     Medication List    STOP taking these medications   megestrol 40 MG tablet Commonly known as:  MEGACE     TAKE these medications   docusate sodium 100 MG capsule Commonly known as:  COLACE Take 1 capsule (100 mg total) by mouth 2 (two) times daily. What changed:    when to take this  reasons to take this   FISH OIL + D3 PO Take 1 capsule by mouth at bedtime.   ibuprofen 600 MG tablet Commonly known as:  ADVIL,MOTRIN Take 1 tablet (600 mg total) by mouth every 6 (six) hours as needed (mild pain). Start taking on:  12/29/2017   linaclotide 290 MCG Caps capsule Commonly known as:  LINZESS Take 1 capsule (290 mcg total) by mouth daily before breakfast.   niacin 500 MG tablet Take 500 mg by  mouth at bedtime.   oxyCODONE-acetaminophen 5-325 MG tablet Commonly known as:  PERCOCET/ROXICET Take 1-2 tablets by mouth every 4 (four) hours as needed (pain).   rOPINIRole 1 MG tablet Commonly known as:  REQUIP Take 1 mg by mouth at bedtime.   simvastatin 20 MG tablet Commonly known as:  ZOCOR Take 20 mg by mouth at bedtime.      Follow-up Information    [name], MD  Follow up in 1 week(s).   Specialties:  Obstetrics and Gynecology, Radiology Contact information: [place] [id number] [phone]           Signed: [name] 12/26/2017, 1:50 PM

## 2017-12-29 NOTE — Op Note (Signed)
Please see the brief OP NOTE for surgical details.

## 2017-12-30 ENCOUNTER — Telehealth: Payer: Self-pay | Admitting: *Deleted

## 2017-12-30 ENCOUNTER — Encounter: Payer: Self-pay | Admitting: Obstetrics and Gynecology

## 2017-12-30 DIAGNOSIS — D270 Benign neoplasm of right ovary: Secondary | ICD-10-CM | POA: Insufficient documentation

## 2017-12-30 NOTE — Telephone Encounter (Signed)
Patient states she has had a decrease in appetite along with not passing gas.  She has not taken any pain medication in the last 2 days but is taking Linzess which has made her have a BM but feels like it isn't enough.  She also has a decrease in her appetite.  She is able to drink fluids and eat a "bite or two" but that is it.  Patient is concerned.  Informed patient that her appetite should return but to give it a little more time.   Also is having hot and cold chills. Has appointment on Wednesday and can discuss all issues with Dr Glo Herring at that time.

## 2018-01-01 ENCOUNTER — Encounter: Payer: Self-pay | Admitting: Obstetrics and Gynecology

## 2018-01-01 ENCOUNTER — Ambulatory Visit (INDEPENDENT_AMBULATORY_CARE_PROVIDER_SITE_OTHER): Payer: BLUE CROSS/BLUE SHIELD | Admitting: Obstetrics and Gynecology

## 2018-01-01 DIAGNOSIS — T8149XA Infection following a procedure, other surgical site, initial encounter: Secondary | ICD-10-CM | POA: Diagnosis not present

## 2018-01-01 MED ORDER — DOXYCYCLINE HYCLATE 100 MG PO CAPS: 100.0000 mg | ORAL_CAPSULE | Freq: Two times a day (BID) | ORAL | 1 refills | Status: DC

## 2018-01-01 MED ORDER — OXYCODONE-ACETAMINOPHEN 5-325 MG PO TABS: 1.0000 | ORAL_TABLET | ORAL | 0 refills | Status: DC | PRN

## 2018-01-01 NOTE — Progress Notes (Signed)
Patient ID: Chloe Orr, female   DOB: 1975-11-19, 42 y.o.   MRN: [id number]     Subjective:  Chloe Orr is a 42 y.o. female now 1 weeks status post HYSTERECTOMY SUPRACERVICAL ABDOMINAL, RIGHT SALPINGO OOPHORECTOMY for uterine fibroids.  She has not passed gas, has no problems with bowel movement with linzess and doesn't have an appetite.She denies having a fever, but noticed a bit of drainage this morning and has been feeling nauseas. Pt says she has stopped taking her fluid pills to avoid getting up and going to the bathroom due to pain of getting up. Patient was emptying drain but not milking it.,  So it was not being very effective  Review of Systems Negative except pain with dead fatty tissue at drain site with no odor   Diet:   Loss of appetite   Bowel movements : normal, take linzess to help with bowel passage  Pain is controlled with current analgesics. Medications being used: percoset.  Objective:  BP 127/81 (BP Location: Right Arm, Patient Position: Sitting, Cuff Size: Normal)   Pulse (!) 114   Ht 4\' 11"  (1.499 m)   Wt 173 lb (78.5 kg)   LMP 12/19/2017   BMI 34.94 kg/m  General:Well developed, well nourished.  No acute distress. Abdomen: Bowel sounds normal, soft, non-tender. Pelvic Exam:    External Genitalia:  Normal.    Vagina: Normal    Cervix: surgically absent    Uterus: surgically absent  Incision(s):   Infection at drain site, moderate drainage, erythema, no hernia, mild swelling, moderate tenderness massage of the right side of the incision expresses several cc of somewhat purulent appearing tissue with some liquefied fatty necrosis.  The tissue has no malodor, and wound culture is collected     Assessment:  Post-Op 1 weeks s/p HYSTERECTOMY SUPRACERVICAL ABDOMINAL, RIGHT SALPINGO OOPHORECTOMY   2. Infection at incision site.,  Draining through the old drain site   Plan:  1.Wound care discussed, neosporin on wound, massage area to drain fluid 2. Rx  antibiotic doxycycline 3. Activity restrictions: continue routine postop care 4. return to work: not applicable. 5. Follow up 01/06/2018  By signing my name below, I, Samul Dada, attest that this documentation has been prepared under the direction and in the presence of [name], MD. Electronically Signed: [place]. 01/01/18. 11:26 AM.  I personally performed the services described in this documentation, which was SCRIBED in my presence. The recorded information has been reviewed and considered accurate. It has been edited as necessary during review. [name], MD

## 2018-01-02 DIAGNOSIS — Z029 Encounter for administrative examinations, unspecified: Secondary | ICD-10-CM

## 2018-01-06 ENCOUNTER — Encounter: Payer: Self-pay | Admitting: Obstetrics and Gynecology

## 2018-01-06 ENCOUNTER — Ambulatory Visit (INDEPENDENT_AMBULATORY_CARE_PROVIDER_SITE_OTHER): Payer: BLUE CROSS/BLUE SHIELD | Admitting: Obstetrics and Gynecology

## 2018-01-06 VITALS — BP 120/77 | HR 90 | Ht 59.0 in | Wt 176.2 lb

## 2018-01-06 DIAGNOSIS — T8149XA Infection following a procedure, other surgical site, initial encounter: Secondary | ICD-10-CM

## 2018-01-06 LAB — WOUND CULTURE

## 2018-01-06 MED ORDER — CIPROFLOXACIN HCL 500 MG PO TABS: 500.0000 mg | ORAL_TABLET | Freq: Two times a day (BID) | ORAL | 0 refills | Status: DC

## 2018-01-06 MED ORDER — OXYCODONE-ACETAMINOPHEN 5-325 MG PO TABS: 1.0000 | ORAL_TABLET | ORAL | 0 refills | Status: DC | PRN

## 2018-01-06 NOTE — Progress Notes (Signed)
Patient ID: Chloe Orr, female   DOB: [birth year], 42 y.o.   MRN: [id number]  Subjective:  Chloe Orr is a 42 y.o. female now 2 weeks status post RIGHT SALPINGO OOPHORECTOMY, HYSTERECTOMY SUPRACERVICAL ABDOMINAL.,  Complicated by postoperative wound infection, noted when JP drain removed and qualified fatty and slightly purulent debris expressed from the JP drain site.  The patient stated she had begun to get sore 1 day before return visit  When patient left after last visit on 01/01/2018 she could not express fluid  from drain site. Her daughter could not milk the drain site nor her cousin whom she had called for help.  Per patient, yesterday while active she noted that fluid just began to drain on its on. She denies fever and any pain. She notes that she she had surgery under her arm her symptoms were the same then of her drain site not draining well but then would "begin to gush on it's own".  Review of Systems Negative except minimal drainage   Diet:   normal   Bowel movements : normal.  Pain is controlled with current analgesics. Medications being used: percoset.   Objective:  BP 120/77 (BP Location: Right Arm, Patient Position: Sitting, Cuff Size: Small)   Pulse 90   Ht 4\' 11"  (1.499 m)   Wt 176 lb 3.2 oz (79.9 kg)   LMP 12/19/2017   BMI 35.59 kg/m  General:Well developed, well nourished.  No acute distress. Abdomen: Bowel sounds normal, soft, non-tender. Pelvic Exam:    External Genitalia:  Normal. Pelvic deferred  Incision(s):   Healing well, minimal drainage from drain site only, no erythema, no hernia, no swelling, no dehiscence,     Assessment:  Post-Op 2 weeks s/p RIGHT SALPINGO OOPHORECTOMY, HYSTERECTOMY SUPRACERVICAL ABDOMINAL  Postoperative wound infection with staph aureus (possible contaminant) and Enterobacter cloacae Doing well postoperatively.  Currently no expressible infection or purulence   Plan:  1.Wound care discussed patient to notify me by  phone for any erythema swelling or recurrent  discharge 2. . current medications. 3. Activity restrictions: Can begin driving this weekend 4. return to work: TBD weeks 5. Follow up in 3 weeks. 6. Rx ciprofloxacin for 7 days  By signing my name below, I, Samul Dada, attest that this documentation has been prepared under the direction and in the presence of [name], MD. Electronically Signed: [place]. 01/06/18. 9:50 AM.  I personally performed the services described in this documentation, which was SCRIBED in my presence. The recorded information has been reviewed and considered accurate. It has been edited as necessary during review. [name], MD

## 2018-01-06 NOTE — Addendum Note (Signed)
Addended by: [name] on: 01/06/2018 04:11 PM   Modules accepted: Orders

## 2018-01-15 ENCOUNTER — Telehealth: Payer: Self-pay | Admitting: Obstetrics and Gynecology

## 2018-01-15 NOTE — Telephone Encounter (Signed)
[facility] aware that pt has had a hyst and she no longer needs Lupron. Encounter closed. [place]

## 2018-01-15 NOTE — Telephone Encounter (Signed)
[facility] calling wanted to get an ICD 10 code for Lupron injection for [place]. I looked in chart but could not figure out, I spoke to Dr Glo Herring and he was unable to help me at this time and told me to take a message. Please return call

## 2018-01-20 ENCOUNTER — Telehealth: Payer: Self-pay | Admitting: Obstetrics & Gynecology

## 2018-01-20 ENCOUNTER — Telehealth: Payer: Self-pay | Admitting: *Deleted

## 2018-01-20 MED ORDER — OXYCODONE-ACETAMINOPHEN 5-325 MG PO TABS: 1.0000 | ORAL_TABLET | ORAL | 0 refills | Status: DC | PRN

## 2018-01-20 NOTE — Telephone Encounter (Signed)
Informed pt that refill on percocet had been sent to her pharmacy.

## 2018-01-20 NOTE — Telephone Encounter (Signed)
Pt called stating that she would like a refill on her percocet. She states that she had been seeing Dr Glo Herring for an infection in her incision. She states that he will see her back next week since the drainage has stopped. She states that the pain had gotten better but then last week it started back and it feels like she did when she was 2 weeks post op. She describes the pain as being "sharp". She states that she is having bowel movements and she doesn't feel that she is constipated. She states that there has not been an increase in her activity causing the increase in pain. Advised pt that Dr Glo Herring is out of the office this week but I would send her concerns to Dr Elonda Husky and advise her on what to do. Pt verbalized understanding.

## 2018-01-20 NOTE — Telephone Encounter (Signed)
Pt given a supply of #15 percocet which is 1/2 of the last 2 week supply

## 2018-01-27 ENCOUNTER — Ambulatory Visit (INDEPENDENT_AMBULATORY_CARE_PROVIDER_SITE_OTHER): Payer: BLUE CROSS/BLUE SHIELD | Admitting: Obstetrics and Gynecology

## 2018-01-27 ENCOUNTER — Encounter: Payer: Self-pay | Admitting: Obstetrics and Gynecology

## 2018-01-27 VITALS — BP 138/83 | HR 90 | Ht 59.0 in | Wt 183.4 lb

## 2018-01-27 DIAGNOSIS — Z09 Encounter for follow-up examination after completed treatment for conditions other than malignant neoplasm: Secondary | ICD-10-CM

## 2018-01-27 MED ORDER — HYDROCHLOROTHIAZIDE 25 MG PO TABS: 25.0000 mg | ORAL_TABLET | Freq: Every day | ORAL | 1 refills | Status: DC

## 2018-01-27 NOTE — Progress Notes (Signed)
Patient ID: Chloe Orr, female   DOB: 1976-02-23, 42 y.o.   MRN: [id number] Subjective:  Chloe Orr is a 42 y.o. female now 4 weeks and 4 days weeks status post HYSTERECTOMY SUPRACERVICAL ABDOMINAL, RIGHT SALPINGO OOPHORECTOMY.   Pt has stopped taking pain pills due to the constipation but is taking ibprofen. She has sharp lower abdominal pain. She has this pain when bending over and trying to pick up objects. She notes that her pain feel as if she is healing 2 weeks postop instead of 4 weeks. Pt denies having fever. She has gained weight due to the pain in abdomen. She still feels pressure in abdomen when wearing tight clothing Review of Systems Negative except constipation from medication.   Diet: normal   Bowel movements : well except for constipation.  Pain is controlled with current analgesics. Medications being used: percocet/roxicet.  Objective:  BP 138/83 (BP Location: Right Arm, Patient Position: Sitting, Cuff Size: Normal)   Pulse 90   Ht 4\' 11"  (1.499 m)   Wt 183 lb 6.4 oz (83.2 kg)   LMP 12/19/2017   BMI 37.04 kg/m  General:Well developed, well nourished.  No acute distress. Abdomen: Bowel sounds normal, soft, non-tender. Pelvic Exam:   DEFERRED  Incision(s):   Healing well and less stiff at incision site, no drainage, no erythema, no hernia, no swelling, no dehiscence,  Assessment:  Post-Op 4 weeks and 4 days weeks s/p HYSTERECTOMY SUPRACERVICAL ABDOMINAL, RIGHT SALPINGO OOPHORECTOMY   Has abdominal pressure but otherwise is doing well postoperatively.   Plan:  1.Wound care discussed 2. Current medications.  3. Fluid pill   Re-Rx Hctz x 30 days 3. Activity restrictions: out of work  already 4. return to work: to be determined. 5. Follow up in 2 weeks. To consider extending out of work an additional 2 wks.  By signing my name below, I, Samul Dada, attest that this documentation has been prepared under the direction and in the presence of [name], MD. Electronically Signed: [place]. 01/27/18. 9:35 AM.  I personally performed the services described in this documentation, which was SCRIBED in my presence. The recorded information has been reviewed and considered accurate. It has been edited as necessary during review. [name], MD

## 2018-01-28 ENCOUNTER — Ambulatory Visit (INDEPENDENT_AMBULATORY_CARE_PROVIDER_SITE_OTHER): Payer: BLUE CROSS/BLUE SHIELD | Admitting: Nurse Practitioner

## 2018-01-28 ENCOUNTER — Encounter: Payer: Self-pay | Admitting: Gastroenterology

## 2018-01-28 ENCOUNTER — Encounter: Payer: Self-pay | Admitting: Nurse Practitioner

## 2018-01-28 VITALS — BP 127/89 | HR 86 | Temp 97.9°F | Ht 59.0 in | Wt 181.8 lb

## 2018-01-28 DIAGNOSIS — K5900 Constipation, unspecified: Secondary | ICD-10-CM

## 2018-01-28 NOTE — Progress Notes (Signed)
Patient left without being seen due to forgetting her daughter needed to be taking the camp.  Office visit canceled.  No charge.

## 2018-02-12 ENCOUNTER — Encounter: Payer: Self-pay | Admitting: Obstetrics and Gynecology

## 2018-02-12 ENCOUNTER — Ambulatory Visit (INDEPENDENT_AMBULATORY_CARE_PROVIDER_SITE_OTHER): Payer: BLUE CROSS/BLUE SHIELD | Admitting: Obstetrics and Gynecology

## 2018-02-12 VITALS — BP 127/84 | HR 82 | Wt 183.4 lb

## 2018-02-12 DIAGNOSIS — Z09 Encounter for follow-up examination after completed treatment for conditions other than malignant neoplasm: Secondary | ICD-10-CM

## 2018-02-12 DIAGNOSIS — Z9889 Other specified postprocedural states: Secondary | ICD-10-CM

## 2018-02-12 NOTE — Progress Notes (Signed)
Patient ID: Chloe Orr, female   DOB: 1976/07/20, 42 y.o.   MRN: [id number]     Subjective:  [name] is a 42 y.o. female now 7 weeks status post hysterectomy supracervical abdominal, right salpingo oophorectomy.  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 Review of Systems Negative except    Diet:   normal   Bowel movements : normal.  The patient is not having any pain.  Objective:  BP 127/84 (BP Location: Right Arm, Patient Position: Sitting, Cuff Size: Normal)   Pulse 82   Wt 183 lb 6.4 oz (83.2 kg)   LMP 12/19/2017   BMI 37.04 kg/m  General:Well developed, well nourished.  No acute distress. Abdomen: Bowel sounds normal, soft, non-tender.  Incision is well-healed.  We did a partial panniculectomy to allow for access to the pelvis and had to do a T-incision in the middle these of all well-healed Pelvic Exam:    External Genitalia:  Normal.    Vagina: Normal    Cervix: Normal    Uterus: Normal    Adnexa/Bimanual: Non tender cervix mobile,  Incision(s):Healing well, no drainage, no erythema, no hernia, no swelling, no dehiscence,     Assessment:  Post-Op 7 weeks s/p hysterectomy supracervical abdominal, right salpingo oophorectomy   Doing well postoperatively.   Plan:  3. Activity restrictions: none 4. return to work: now 02/13/18 full duty 5. Follow up in 1 years.  By signing my name below, I, Samul Dada, attest that this documentation has been prepared under the direction and in the presence of [name], MD. Electronically Signed: [place]. 02/12/18. 11:17 AM. I personally performed the services described in this documentation, which was SCRIBED in my presence. The recorded information has been reviewed and  considered accurate. It has been edited as necessary during review. [name], MD

## 2018-03-26 ENCOUNTER — Telehealth: Payer: Self-pay | Admitting: Nurse Practitioner

## 2018-03-26 ENCOUNTER — Ambulatory Visit: Payer: BLUE CROSS/BLUE SHIELD | Admitting: Nurse Practitioner

## 2018-03-26 ENCOUNTER — Encounter: Payer: Self-pay | Admitting: Nurse Practitioner

## 2018-03-26 NOTE — Progress Notes (Deleted)
Referring Provider: Jake Samples, PA* Primary Care Physician:  Jake Samples, PA-C Primary GI:  Dr.   Rayne Du chief complaint on file.   HPI:   Chloe Orr is a 42 y.o. female who presents for follow-up on constipation and bloating.  The patient was last seen in our office 11/05/2017 for the same.  At that time she is doing okay overall with some distended, bloated abdomen and a bowel movement every 3 or 4 days.  She is taken up to 6 laxatives at a time with only one bowel movement.  Denies abdominal pain however as well as nausea and vomiting.  Chronic history of constipation for 20 some years.  Drinks significant water and started a new diet with mostly fish, corn, green/vegetables.  Known uterine fibroid with lower abdominal fullness, scheduled to be seen by gynecology.  Recommended abdominal x-ray, start Linzess 145 mcg, progress report in 2 weeks, follow-up in 2 months.  X-ray completed 11/06/2017 which found essentially no acute process.  She called our office on 11/19/2017 stating Linzess initially helped but then the past 3 days it is not and requested an increased dose.  She was provided with Linzess 290 mcg samples.  She call us back 12/10/2017 stating that 290 mcg Linzess dose was working well and requested a prescription which was sent to her pharmacy.  It appears on 12/24/2017 she did undergo a supracervical abdominal hysterectomy.  She did have some prolonged discomfort and abdominal fullness but at her last follow-up visit on 02/12/2018 she was doing better "almost normal" and noted to be returning to work the following day.  She did show for an office visit in our office 01/28/2018 but had to leave because she forgot her daughter needed to be taken to camp.  Today she states   Past Medical History:  Diagnosis Date  . Fibroid uterus   . Hypercholesteremia     Past Surgical History:  Procedure Laterality Date  . BREAST REDUCTION SURGERY Bilateral 09/20/2016   [place]  . [place], 2010   [facility]  . MASS EXCISION  2011   right arm mass excision  . MASS EXCISION  02/11/2012   Procedure: EXCISION MASS;  Surgeon: Jamesetta So, MD;  Location: [facility];  Service: General;  Laterality: Left;  Excision Neoplasm Left Axilla  . SALPINGOOPHORECTOMY Right 12/24/2017   Procedure: RIGHT SALPINGO OOPHORECTOMY;  Surgeon: [name], MD;  Location: [facility];  Service: Gynecology;  Laterality: Right;  . SUPRACERVICAL ABDOMINAL HYSTERECTOMY N/A 12/24/2017   Procedure: HYSTERECTOMY SUPRACERVICAL ABDOMINAL;  Surgeon: [name], MD;  Location: [facility];  Service: Gynecology;  Laterality: N/A;  . TUBAL LIGATION      Current Outpatient Medications  Medication Sig Dispense Refill  . Fish Oil-Cholecalciferol (FISH OIL + D3 PO) Take 1 capsule by mouth at bedtime.    Marland Kitchen ibuprofen (ADVIL,MOTRIN) 600 MG tablet Take 1 tablet (600 mg total) by mouth every 6 (six) hours as needed (mild pain). (Patient not taking: Reported on 02/12/2018) 30 tablet 0  . linaclotide (LINZESS) 290 MCG CAPS capsule Take 1 capsule (290 mcg total) by mouth daily before breakfast. 90 capsule 3  . niacin 500 MG tablet Take 500 mg by mouth at bedtime.    Marland Kitchen oxyCODONE-acetaminophen (PERCOCET/ROXICET) 5-325 MG tablet Take 1 tablet by mouth every 4 (four) hours as needed for severe pain. (Patient not taking: Reported on 02/12/2018) 15 tablet 0  . rOPINIRole (REQUIP) 1 MG tablet  Take 1 mg by mouth at bedtime.   1  . simvastatin (ZOCOR) 20 MG tablet Take 20 mg by mouth at bedtime.     No current facility-administered medications for this visit.     Allergies as of 03/26/2018  . (No Known Allergies)    Family History  Problem Relation Age of Onset  . Diabetes Father   . Birth defects Sister        cleft lip   . Colon cancer Neg Hx   . Gastric cancer Neg Hx   . Esophageal cancer Neg Hx     Social History   Socioeconomic History  . Marital status: Single    Spouse name: Not on  file  . Number of children: Not on file  . Years of education: Not on file  . Highest education level: Not on file  Occupational History  . Not on file  Social Needs  . Financial resource strain: Not on file  . Food insecurity:    Worry: Not on file    Inability: Not on file  . Transportation needs:    Medical: Not on file    Non-medical: Not on file  Tobacco Use  . Smoking status: Never Smoker  . Smokeless tobacco: Never Used  Substance and Sexual Activity  . Alcohol use: Yes    Comment: Currently (11/05/17): occasional glass of wine. Previously 3 beer daily, stopped drinking beer 10/21/17.  . Drug use: No  . Sexual activity: Not Currently    Birth control/protection: Surgical    Comment: supracervical hyst  Lifestyle  . Physical activity:    Days per week: Not on file    Minutes per session: Not on file  . Stress: Not on file  Relationships  . Social connections:    Talks on phone: Not on file    Gets together: Not on file    Attends religious service: Not on file    Active member of club or organization: Not on file    Attends meetings of clubs or organizations: Not on file    Relationship status: Not on file  Other Topics Concern  . Not on file  Social History Narrative  . Not on file    Review of Systems: General: Negative for anorexia, weight loss, fever, chills, fatigue, weakness. Eyes: Negative for vision changes.  ENT: Negative for hoarseness, difficulty swallowing , nasal congestion. CV: Negative for chest pain, angina, palpitations, dyspnea on exertion, peripheral edema.  Respiratory: Negative for dyspnea at rest, dyspnea on exertion, cough, sputum, wheezing.  GI: See history of present illness. GU:  Negative for dysuria, hematuria, urinary incontinence, urinary frequency, nocturnal urination.  MS: Negative for joint pain, low back pain.  Derm: Negative for rash or itching.  Neuro: Negative for weakness, abnormal sensation, seizure, frequent headaches,  memory loss, confusion.  Psych: Negative for anxiety, depression, suicidal ideation, hallucinations.  Endo: Negative for unusual weight change.  Heme: Negative for bruising or bleeding. Allergy: Negative for rash or hives.   Physical Exam: LMP 12/19/2017  General:   Alert and oriented. Pleasant and cooperative. Well-nourished and well-developed.  Head:  Normocephalic and atraumatic. Eyes:  Without icterus, sclera clear and conjunctiva pink.  Ears:  Normal auditory acuity. Mouth:  No deformity or lesions, oral mucosa pink.  Throat/Neck:  Supple, without mass or thyromegaly. Cardiovascular:  S1, S2 present without murmurs appreciated. Normal pulses noted. Extremities without clubbing or edema. Respiratory:  Clear to auscultation bilaterally. No wheezes, rales, or rhonchi. No distress.  Gastrointestinal:  +BS, soft, non-tender and non-distended. No HSM noted. No guarding or rebound. No masses appreciated.  Rectal:  Deferred  Musculoskalatal:  Symmetrical without gross deformities. Normal posture. Skin:  Intact without significant lesions or rashes. Neurologic:  Alert and oriented x4;  grossly normal neurologically. Psych:  Alert and cooperative. Normal mood and affect. Heme/Lymph/Immune: No significant cervical adenopathy. No excessive bruising noted.    03/26/2018 8:01 AM   Disclaimer: This note was dictated with voice recognition software. Similar sounding words can inadvertently be transcribed and may not be corrected upon review.

## 2018-03-26 NOTE — Telephone Encounter (Signed)
Reviewed

## 2018-03-26 NOTE — Telephone Encounter (Signed)
Patient was a no show and letter sent  °

## 2018-03-28 DIAGNOSIS — Z01411 Encounter for gynecological examination (general) (routine) with abnormal findings: Secondary | ICD-10-CM | POA: Diagnosis not present

## 2018-03-28 DIAGNOSIS — Z6837 Body mass index (BMI) 37.0-37.9, adult: Secondary | ICD-10-CM | POA: Diagnosis not present

## 2018-03-28 DIAGNOSIS — E782 Mixed hyperlipidemia: Secondary | ICD-10-CM | POA: Diagnosis not present

## 2018-03-28 DIAGNOSIS — Z1389 Encounter for screening for other disorder: Secondary | ICD-10-CM | POA: Diagnosis not present

## 2018-04-07 DIAGNOSIS — Z0000 Encounter for general adult medical examination without abnormal findings: Secondary | ICD-10-CM | POA: Diagnosis not present

## 2018-04-23 ENCOUNTER — Telehealth: Payer: Self-pay | Admitting: Gastroenterology

## 2018-04-23 NOTE — Telephone Encounter (Signed)
[place] FOR [facility] DX: ELEVATED LIVER ENZYMES OCT 10 @1130  OR OCT 16.

## 2018-04-28 NOTE — Telephone Encounter (Signed)
SCHEDULED PATIENT FOR 10/16 AND LEFT A MESSAGE ON HER HOME AND CELL PHONE

## 2018-04-28 NOTE — Telephone Encounter (Signed)
REVIEWED-NO ADDITIONAL RECOMMENDATIONS. 

## 2018-04-29 ENCOUNTER — Encounter: Payer: BLUE CROSS/BLUE SHIELD | Attending: Internal Medicine | Admitting: Nutrition

## 2018-04-29 VITALS — Ht 59.0 in | Wt 188.0 lb

## 2018-04-29 DIAGNOSIS — R635 Abnormal weight gain: Secondary | ICD-10-CM

## 2018-04-29 DIAGNOSIS — E669 Obesity, unspecified: Secondary | ICD-10-CM | POA: Diagnosis not present

## 2018-04-29 DIAGNOSIS — Z6837 Body mass index (BMI) 37.0-37.9, adult: Secondary | ICD-10-CM

## 2018-04-29 NOTE — Progress Notes (Signed)
Medical Nutrition Therapy:  Appt start time: 1130 end time:  1230.   Assessment:  Primary concerns today: Obesity. Lives with her kids. Works a full time and part time. Wants to  Lose weight.  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 PCP: Delman Cheadle, FNP.  Had hysterectomty in JUly 2019.  Has gained 30 lbs in the last year.  Eats 2-3 meals per day. Most foods are eaten out and does cook some at home. Usually eats late. Has been skipping beakfast. Doesn't drink much water. Does drink alcohol-beer a few nightly.  Goes to the [facility]. Weighed 150 lbs a year ago. Willing to make lifestyle changes with food and exercise to lose weight. Vitals with BMI 04/29/2018  Height 4\' 11"   Weight 188 lbs  BMI 74.08  Systolic   Diastolic   Pulse   Respirations   Results for Chloe, Orr (MRN [id number]) as of 05/05/2018 10:18  Ref. Range 12/25/2017 14:97  BASIC METABOLIC PANEL Unknown Rpt (A)  Sodium Latest Ref Range: 135 - 145 mmol/L 138  Potassium Latest Ref Range: 3.5 - 5.1 mmol/L 4.1  Chloride Latest Ref Range: 101 - 111 mmol/L 109  CO2 Latest Ref Range: 22 - 32 mmol/L 24  Glucose Latest Ref Range: 65 - 99 mg/dL 124 (H)  BUN Latest Ref Range: 6 - 20 mg/dL 13  Creatinine Latest Ref Range: 0.44 - 1.00 mg/dL 0.88  Calcium Latest Ref Range: 8.9 - 10.3 mg/dL 8.2 (L)  Anion gap Latest Ref Range: 5 - 15  5  GFR, Est Non African American Latest Ref Range: >60 mL/min >60  GFR, Est African American Latest Ref Range: >60 mL/min >60  WBC Latest Ref Range: 4.0 - 10.5 K/uL 11.1 (H)  RBC Latest Ref Range: 3.87 - 5.11 MIL/uL 3.83 (L)  Hemoglobin Latest Ref Range: 12.0 - 15.0 g/dL 11.1 (L)  HCT Latest Ref Range: 36.0 - 46.0 % 36.0  MCV Latest Ref Range: 78.0 - 100.0 fL 94.0  MCH Latest Ref Range: 26.0 - 34.0 pg 29.0  MCHC Latest Ref Range: 30.0 - 36.0 g/dL 30.8  RDW Latest Ref Range: 11.5 - 15.5 % 15.1  Platelets Latest Ref Range: 150 - 400 K/uL 284    Preferred  Learning Style:   No preference indicated   Learning Readiness:  Ready  Change in progress   MEDICATIONS: see list   DIETARY INTAKE: 24-hr recall:  B ( AM): skips usually Snk ( AM):   L ( PM): Salad loaded with french dressing, water Snk ( PM): snack, misc sweets or chips D ( PM): Deviled eggs, 2, [place] necks, water OR BBQ , fries, hot dog with bun,  Snk ( PM): Greens and bbq chicken  Beverages: Beer 1-2 per night- [employer] LIght 24 oz.  Usual physical activity: waer  Estimated energy needs: 1500  calories 170 g carbohydrates 112 g protein 42 g fat  Progress Towards Goal(s):  In progress.   Nutritional Diagnosis:  NB-1.1 Food and nutrition-related knowledge deficit As related to Obesity.  As evidenced by BMI > 30.    Intervention:  Healthy weight loss Nutrition and pre Diabetes education provided on My Plate, CHO counting, meal planning, portion sizes, timing of meals, avoiding snacks between meals benefits of exercising 60 minutes per day and prevention of  DM. IBW and healthy eating. Avoid processed foods. Goals Goals 1. Follow My Plat 2. Cut out salt, sugar and junk food  3. Cut out eating fast foods 4. Increase fresh fruits and vegetalbes Drink water 4 bottles. Cut out alcohol. Lose 1 lb per week.  .  Teaching Method Utilized:  Visual Auditory Hands on  Handouts given during visit include:  The Plate Method  Meal Plan Card   Barriers to learning/adherence to lifestyle change: none  Demonstrated degree of understanding via:  Teach Back   Monitoring/Evaluation:  Dietary intake, exercise, meal planing, and body weight in 1 month(s).

## 2018-04-29 NOTE — Patient Instructions (Addendum)
Goals 1. Follow My Plat 2. Cut out salt, sugar and junk food 3. Cut out eating fast foods 4. Increase fresh fruits and vegetalbes Drink water 4 bottles. Cut out alcohol. Lose 1 lb per week.

## 2018-05-05 ENCOUNTER — Emergency Department (HOSPITAL_COMMUNITY)
Admission: EM | Admit: 2018-05-05 | Discharge: 2018-05-05 | Disposition: A | Discharge status: Home / Self Care | Payer: BLUE CROSS/BLUE SHIELD | Attending: Emergency Medicine | Admitting: Emergency Medicine

## 2018-05-05 ENCOUNTER — Other Ambulatory Visit: Payer: Self-pay

## 2018-05-05 ENCOUNTER — Encounter: Payer: Self-pay | Admitting: Nutrition

## 2018-05-05 ENCOUNTER — Encounter (HOSPITAL_COMMUNITY): Payer: Self-pay

## 2018-05-05 DIAGNOSIS — Z79899 Other long term (current) drug therapy: Secondary | ICD-10-CM | POA: Insufficient documentation

## 2018-05-05 DIAGNOSIS — H10023 Other mucopurulent conjunctivitis, bilateral: Secondary | ICD-10-CM | POA: Diagnosis present

## 2018-05-05 DIAGNOSIS — H1013 Acute atopic conjunctivitis, bilateral: Secondary | ICD-10-CM | POA: Insufficient documentation

## 2018-05-05 MED ORDER — CETIRIZINE HCL 10 MG PO TABS: 10.0000 mg | ORAL_TABLET | Freq: Every day | ORAL | 0 refills | Status: DC

## 2018-05-05 NOTE — ED Provider Notes (Signed)
[facility] EMERGENCY DEPARTMENT Provider Note   CSN: [id number] Arrival date & time: 05/05/18  3419     History   Chief Complaint Chief Complaint  Patient presents with  . Eye Drainage    HPI Chloe Orr is a 42 y.o. female presenting for evaluation of bilateral eye itching and drainage.  Patient states for the past 2 days, she has had bilateral eye itching.  She reports intermittent drainage from the corner of her eyes.  She has been using eyedrops with minimal to no improvement.  She denies eye pain, vision changes, or photophobia.  She denies sick contacts.  She denies associated fevers, chills, ear pain, nasal congestion, sore throat, cough.  She has a history of seasonal allergies, does not take allergy medication.  She works as a [employer]ar[employer] manager.  Patient states she takes medication for her bladder and cholesterol, takes no other medications.  She is not immunocompromised.  HPI  Past Medical History:  Diagnosis Date  . Fibroid uterus   . Hypercholesteremia     Patient Active Problem List   Diagnosis Date Noted  . Postoperative wound infection 01/01/2018  . 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 Lower abdominal pain 12/24/2017  . Postprocedural pelvic peritoneal adhesions 12/24/2017  . S/P subtotal hysterectomy 12/24/2017  . Constipation 11/05/2017  . Bloating 11/05/2017  . Menorrhagia with irregular cycle 10/05/2015  . Shingles outbreak 09/02/2014    Past Surgical History:  Procedure Laterality Date  . BREAST REDUCTION SURGERY Bilateral 09/20/2016   [place]  . [place], 2010   [facility]  . MASS EXCISION  2011   right arm mass excision  . MASS EXCISION  02/11/2012   Procedure: EXCISION MASS;  Surgeon: Jamesetta So, MD;  Location: [facility];  Service: General;  Laterality: Left;  Excision Neoplasm Left Axilla  . SALPINGOOPHORECTOMY Right 12/24/2017   Procedure: RIGHT SALPINGO OOPHORECTOMY;  Surgeon: [name], MD;  Location: [facility];   Service: Gynecology;  Laterality: Right;  . SUPRACERVICAL ABDOMINAL HYSTERECTOMY N/A 12/24/2017   Procedure: HYSTERECTOMY SUPRACERVICAL ABDOMINAL;  Surgeon: [name], MD;  Location: [facility];  Service: Gynecology;  Laterality: N/A;  . TUBAL LIGATION       OB History    Gravida  3   Para  2   Term  2   Preterm      AB  1   Living  2     SAB      TAB      Ectopic      Multiple      Live Births               Home Medications    Prior to Admission medications   Medication Sig Start Date End Date Taking? Authorizing Provider  cetirizine (ZYRTEC) 10 MG tablet Take 1 tablet (10 mg total) by mouth daily. 05/05/18   Aparna Vanderweele, PA-C  Fish Oil-Cholecalciferol (FISH OIL + D3 PO) Take 1 capsule by mouth at bedtime.    [provider]  ibuprofen (ADVIL,MOTRIN) 600 MG tablet Take 1 tablet (600 mg total) by mouth every 6 (six) hours as needed (mild pain). Patient not taking: Reported on 02/12/2018 12/29/17   [name], MD  linaclotide Rolan Lipa) 290 MCG CAPS capsule Take 1 capsule (290 mcg total) by mouth daily before breakfast. Patient not taking: Reported on 04/29/2018 12/12/17   Carlis Stable, NP  niacin 500 MG tablet Take 500 mg by mouth  at bedtime.    [provider]  oxyCODONE-acetaminophen (PERCOCET/ROXICET) 5-325 MG tablet Take 1 tablet by mouth every 4 (four) hours as needed for severe pain. Patient not taking: Reported on 02/12/2018 01/20/18   Florian Buff, MD  rOPINIRole (REQUIP) 1 MG tablet Take 1 mg by mouth at bedtime.  11/12/17   [provider]  simvastatin (ZOCOR) 20 MG tablet Take 20 mg by mouth at bedtime.    [provider]    Family History Family History  Problem Relation Age of Onset  . Diabetes Father   . Birth defects Sister        cleft lip   . Colon cancer Neg Hx   . Gastric cancer Neg Hx   . Esophageal cancer Neg Hx     Social History Social History   Tobacco Use  . Smoking status: Never  Smoker  . Smokeless tobacco: Never Used  Substance Use Topics  . Alcohol use: Yes    Comment: Currently (11/05/17): occasional glass of wine. Previously 3 beer daily, stopped drinking beer 10/21/17.  . Drug use: No     Allergies   Patient has no known allergies.   Review of Systems Review of Systems  Constitutional: Negative for fever.  HENT: Negative for congestion.   Eyes: Positive for discharge and itching. Negative for photophobia, pain, redness and visual disturbance.     Physical Exam Updated Vital Signs BP (!) 130/91 (BP Location: Right Arm)   Pulse 71   Temp 98.1 F (36.7 C) (Oral)   Resp 16   LMP 12/19/2017   SpO2 97%   Physical Exam  Constitutional: She is oriented to person, place, and time. She appears well-developed and well-nourished. No distress.  Appears nontoxic  HENT:  Head: Normocephalic and atraumatic.  OP clear without tonsillar swelling or exudate.  Uvula midline with equal palate rise.  TMs nonerythematous and not bulging bilaterally.  No nasal mucosal edema.  Eyes: Pupils are equal, round, and reactive to light. Conjunctivae, EOM and lids are normal. Right eye exhibits chemosis. Left eye exhibits chemosis.  Mild bilateral chemosis.  No conjunctival injection.  EOMI and PERRLA.  No significant periorbital edema or erythema.  No drainage noted.  Neck: Normal range of motion.  Pulmonary/Chest: Effort normal.  Abdominal: She exhibits no distension.  Musculoskeletal: Normal range of motion.  Neurological: She is alert and oriented to person, place, and time.  Skin: Skin is warm. No rash noted.  Psychiatric: She has a normal mood and affect.  Nursing note and vitals reviewed.    ED Treatments / Results  Labs (all labs ordered are listed, but only abnormal results are displayed) Labs Reviewed - No data to display  EKG None  Radiology No results found.  Procedures Procedures (including critical care time)  Medications Ordered in  ED Medications - No data to display   Initial Impression / Assessment and Plan / ED Course  I have reviewed the triage vital signs and the nursing notes.  Pertinent labs & imaging results that were available during my care of the patient were reviewed by me and considered in my medical decision making (see chart for details).     Patient presented for evaluation of bilateral eye itching and drainage.  Physical exam reassuring, she is not having any eye pain or vision changes.  History and exam consistent with mild conjunctivitis, likely allergic.  Doubt bacterial conjunctivitis at this time.  Discussed with patient.  Discussed treatment with allergy medication,  cool compresses, and continuing lubricating eyedrops as needed.  Patient to follow-up with her PCP as needed.  At this time, patient appears safe for discharge.  Return precautions given.  Patient states she understands and agrees to plan.   Final Clinical Impressions(s) / ED Diagnoses   Final diagnoses:  Allergic conjunctivitis of both eyes    ED Discharge Orders         Ordered    cetirizine (ZYRTEC) 10 MG tablet  Daily     05/05/18 0851           Franchot Heidelberg, PA-C 05/05/18 9791    Davonna Belling, MD 05/05/18 1525

## 2018-05-05 NOTE — Discharge Instructions (Addendum)
Take allergy medicine daily to help with itching. Use a cool compress to help with itching and swelling. You may continue to use locating eyedrops as needed. Try not to touch or irritate your eyes otherwise.  If you do touch your eyes, wash your hands immediately afterwards. Follow-up with your primary care doctor if your symptoms are not improving. Return to the emergency room if you develop severe eye pain, vision loss, or any new or concerning symptoms.

## 2018-05-05 NOTE — ED Triage Notes (Signed)
Pt reports that both eyes have been watery and itching for 3 days. Reports yellow drainage in the corner of eyes at times

## 2018-05-07 ENCOUNTER — Ambulatory Visit: Payer: BLUE CROSS/BLUE SHIELD | Admitting: Gastroenterology

## 2018-05-07 ENCOUNTER — Encounter: Payer: Self-pay | Admitting: *Deleted

## 2018-05-07 ENCOUNTER — Encounter: Payer: Self-pay | Admitting: Gastroenterology

## 2018-05-07 DIAGNOSIS — K5901 Slow transit constipation: Secondary | ICD-10-CM

## 2018-05-07 DIAGNOSIS — R740 Nonspecific elevation of levels of transaminase and lactic acid dehydrogenase [LDH]: Secondary | ICD-10-CM | POA: Diagnosis not present

## 2018-05-07 DIAGNOSIS — R7401 Elevation of levels of liver transaminase levels: Secondary | ICD-10-CM

## 2018-05-07 DIAGNOSIS — Z6838 Body mass index (BMI) 38.0-38.9, adult: Secondary | ICD-10-CM

## 2018-05-07 MED ORDER — METFORMIN HCL 500 MG PO TABS: 500.0000 mg | ORAL_TABLET | Freq: Every day | ORAL | 11 refills | Status: DC

## 2018-05-07 MED ORDER — DIETHYLPROPION HCL 25 MG PO TABS: ORAL_TABLET | ORAL | 0 refills | Status: DC

## 2018-05-07 NOTE — Assessment & Plan Note (Signed)
ASSOCIATED WITH DAILY ETOH USE, AND ELEVATED FASTING BLOOD GLUCOSE, AND EXCESS CALORIC INTAKE  CONTINUE WITH NUTRITION. TENUATE 25 MG 30 MINS PRIOR TO MEALS TREE TIMES A DAY. MED SIDE EFFECTS DISCUSSED.

## 2018-05-07 NOTE — Progress Notes (Signed)
ON RECALL  °

## 2018-05-07 NOTE — Addendum Note (Signed)
Addended by: Barney Drain L on: 05/07/2018 12:12 PM   Modules accepted: Orders

## 2018-05-07 NOTE — Progress Notes (Signed)
   Subjective:    Patient ID: Chloe Orr, female    DOB: 05-05-1976, 42 y.o.   MRN: [id number]  Jake Samples, PA-C   HPI SEP 2019 ELEVATED LIVER ENZYMES. HAS TATTOOS(FOUR). NO pRBCs. ETOH:  2 BEERS A NIGHT(24 OZ COORS LIGHT) FOR PAST YEARS. WEIGHT GAIN: 157 LBS IN 2016 AND NOW 189 LBS. SAW NUTRITION. WANTS WEIGHT LOSS MEDS. LINZESS 290 MCG AND BM WHEN SHE TAKES IT BUT SOMETIMES IT DOESN'T WORK. CONSTIPATED SUN/MON.  PT DENIES FEVER, CHILLS, HEMATOCHEZIA, HEMATEMESIS, nausea, vomiting, melena, diarrhea, CHEST PAIN, SHORTNESS OF BREATH,  CHANGE IN BOWEL IN HABITS, abdominal pain, problems swallowing, problems with sedation, OR heartburn or indigestion.  Past Medical History:  Diagnosis Date  . Fibroid uterus   . Hypercholesteremia    Past Surgical History:  Procedure Laterality Date  . BREAST REDUCTION SURGERY Bilateral 09/20/2016   [place]  . [place], 2010   [facility]  . MASS EXCISION  2011   right arm mass excision  . MASS EXCISION  02/11/2012   Procedure: EXCISION MASS;  Surgeon: Jamesetta So, MD;  Location: [facility];  Service: General;  Laterality: Left;  Excision Neoplasm Left Axilla  . SALPINGOOPHORECTOMY Right 12/24/2017   Procedure: RIGHT SALPINGO OOPHORECTOMY;  Surgeon: [name], MD;  Location: [facility];  Service: Gynecology;  Laterality: Right;  . SUPRACERVICAL ABDOMINAL HYSTERECTOMY N/A 12/24/2017   Procedure: HYSTERECTOMY SUPRACERVICAL ABDOMINAL;  Surgeon: [name], MD;  Location: [facility];  Service: Gynecology;  Laterality: N/A;  . TUBAL LIGATION     No Known Allergies  Current Outpatient Medications  Medication Sig    . cetirizine (ZYRTEC) 10 MG tablet Take 1 tablet (10 mg total) by mouth daily.    . Fish Oil-Cholecalciferol (FISH OIL + D3 PO) Take 1 capsule by mouth at bedtime.    Marland Kitchen linaclotide (LINZESS) 290 MCG CAPS capsule Take 1 capsule (290 mcg total) by mouth daily before breakfast.    . niacin 500 MG tablet Take 500 mg by  mouth at bedtime.    Marland Kitchen rOPINIRole (REQUIP) 1 MG tablet Take 1 mg by mouth at bedtime.     . simvastatin (ZOCOR) 20 MG tablet Take 20 mg by mouth at bedtime.    .      .       Review of Systems PER HPI OTHERWISE ALL SYSTEMS ARE NEGATIVE.    Objective:   Physical Exam  Constitutional: She is oriented to person, place, and time. She appears well-developed and well-nourished. No distress.  HENT:  Head: Normocephalic and atraumatic.  Mouth/Throat: Oropharynx is clear and moist. No oropharyngeal exudate.  Eyes: Pupils are equal, round, and reactive to light. No scleral icterus.  Neck: Normal range of motion. Neck supple.  Cardiovascular: Normal rate, regular rhythm and normal heart sounds.  Pulmonary/Chest: Effort normal and breath sounds normal. No respiratory distress.  Abdominal: Soft. Bowel sounds are normal. She exhibits no distension. There is no tenderness.  Musculoskeletal: She exhibits no edema.  Lymphadenopathy:    She has no cervical adenopathy.  Neurological: She is alert and oriented to person, place, and time.  NO FOCAL DEFICITS  Psychiatric: She has a normal mood and affect.  Vitals reviewed.     Assessment & Plan:

## 2018-05-07 NOTE — Assessment & Plan Note (Signed)
SYMPTOMS NOT CONTROLLED.  DRINK WATER EAT FIBER CONTINUE LINZESS FOLLOW UP IN 4 MOS.

## 2018-05-07 NOTE — Assessment & Plan Note (Signed)
ASSOCIATED WITH RECENT ETOH USE. RISK FACTORS FOR CNA.  COMPLETE LABS TODAY.  HOLD ETOH FOR 2 WEEKS. RECHECK LIVER PANEL IN 2 WEEKS. COMPLETE ULTRASOUND. FOLLOW UP IN 4 MOS.

## 2018-05-07 NOTE — Patient Instructions (Addendum)
DRINK WATER TO KEEP YOUR URINE LIGHT YELLOW.  FOLLOW A HIGH FIBER DIET. AVOID ITEMS THAT CAUSE BLOATING & GAS. SEE INFO BELOW.  TO LOSE WEIGHT: 1. TAKE TENUATE 25 MG 30 MINS PRIOR TO MEALS TREE TIMES A DAY.  2. ADD GLUCOPHAGE 500 MG DAILY. 3. CONTINUE WITH NUTRITION RECOMMENDATIONS.  COMPLETE LABS & ULTRASOUND WITHIN 7 DAYS.  CONTINUE LINZESS OR SAMPLES TO HAVE A COMPLETE BM.   WHEN YOU ARE READY, ONLY DRINK ONE COORS DAILY FOR 2 WEEKS AND HAVE LIVER ENZYMES RECHECKED.  FOLLOW UP IN 6 MOS.     High-Fiber Diet A high-fiber diet changes your normal diet to include more whole grains, legumes, fruits, and vegetables. Changes in the diet involve replacing refined carbohydrates with unrefined foods. The calorie level of the diet is essentially unchanged. The Dietary Reference Intake (recommended amount) for adult males is 38 grams per day. For adult females, it is 25 grams per day. Pregnant and lactating women should consume 28 grams of fiber per day.Fiber is the intact part of a plant that is not broken down during digestion. Functional fiber is fiber that has been isolated from the plant to provide a beneficial effect in the body.  PURPOSE  Increase stool bulk.   Ease and regulate bowel movements.   Lower cholesterol.   REDUCE RISK OF COLON CANCER  INDICATIONS THAT YOU NEED MORE FIBER  Constipation and hemorrhoids.   Uncomplicated diverticulosis (intestine condition) and irritable bowel syndrome.   Weight management.   As a protective measure against hardening of the arteries (atherosclerosis), diabetes, and cancer.   GUIDELINES FOR INCREASING FIBER IN THE DIET  Start adding fiber to the diet slowly. A gradual increase of about 5 more grams (2 slices of whole-wheat bread, 2 servings of most fruits or vegetables, or 1 bowl of high-fiber cereal) per day is best. Too rapid an increase in fiber may result in constipation, flatulence, and bloating.   Drink enough water and fluids  to keep your urine clear or pale yellow. Water, juice, or caffeine-free drinks are recommended. Not drinking enough fluid may cause constipation.   Eat a variety of high-fiber foods rather than one type of fiber.   Try to increase your intake of fiber through using high-fiber foods rather than fiber pills or supplements that contain small amounts of fiber.   The goal is to change the types of food eaten. Do not supplement your present diet with high-fiber foods, but replace foods in your present diet.  INCLUDE A VARIETY OF FIBER SOURCES  Replace refined and processed grains with whole grains, canned fruits with fresh fruits, and incorporate other fiber sources. White rice, white breads, and most bakery goods contain little or no fiber.   Brown whole-grain rice, buckwheat oats, and many fruits and vegetables are all good sources of fiber. These include: broccoli, Brussels sprouts, cabbage, cauliflower, beets, sweet potatoes, white potatoes (skin on), carrots, tomatoes, eggplant, squash, berries, fresh fruits, and dried fruits.   Cereals appear to be the richest source of fiber. Cereal fiber is found in whole grains and bran. Bran is the fiber-rich outer coat of cereal grain, which is largely removed in refining. In whole-grain cereals, the bran remains. In breakfast cereals, the largest amount of fiber is found in those with "bran" in their names. The fiber content is sometimes indicated on the label.   You may need to include additional fruits and vegetables each day.   In baking, for 1 cup white flour, you may  use the following substitutions:   1 cup whole-wheat flour minus 2 tablespoons.   1/2 cup white flour plus 1/2 cup whole-wheat flour.

## 2018-05-07 NOTE — Progress Notes (Signed)
CC'D TO PCP °

## 2018-05-08 DIAGNOSIS — R740 Nonspecific elevation of levels of transaminase and lactic acid dehydrogenase [LDH]: Secondary | ICD-10-CM | POA: Diagnosis not present

## 2018-05-09 ENCOUNTER — Telehealth: Payer: Self-pay | Admitting: Gastroenterology

## 2018-05-09 NOTE — Telephone Encounter (Signed)
PLEASE CALL PT. HER BLOOD TEST IS NEGATIVE FOR HEPATITIS ABC. SHE NEEDS THE HEP AB VACCINE. SHE CAN SEE HER PCP OR GET IT AT [facility] OR [facility].

## 2018-05-09 NOTE — Telephone Encounter (Signed)
PT is aware and I am leaving the order at front desk for her to pick up and take to [facility].

## 2018-05-11 LAB — HEPATITIS C ANTIBODY: Hep C Virus Ab: <0.1 s/co ratio (ref 0.0–0.9)

## 2018-05-11 LAB — HEPATITIS B SURFACE ANTIBODY,QUALITATIVE: Hep B Surface Ab, Qual: NONREACTIVE

## 2018-05-11 LAB — HEPATITIS B SURFACE ANTIGEN: Hepatitis B Surface Ag: NEGATIVE

## 2018-05-11 LAB — HEPATITIS A ANTIBODY, TOTAL: Hep A Total Ab: NEGATIVE

## 2018-05-11 LAB — IGG, IGA, IGM
IgA/Immunoglobulin A, Serum: 183 mg/dL (ref 87–352)
IgG (Immunoglobin G), Serum: 1330 mg/dL (ref 700–1600)
IgM (Immunoglobulin M), Srm: 100 mg/dL (ref 26–217)

## 2018-05-11 LAB — ANA: Anti Nuclear Antibody(ANA): NEGATIVE

## 2018-05-11 LAB — ANTI-SMOOTH MUSCLE ANTIBODY, IGG: Smooth Muscle Ab: 9 Units (ref 0–19)

## 2018-05-11 NOTE — Telephone Encounter (Signed)
PLEASE CALL PT. Her tests for autoimmune hepatitis are negative. She should complete the ultrasound.

## 2018-05-12 ENCOUNTER — Ambulatory Visit (HOSPITAL_COMMUNITY)
Admission: RE | Admit: 2018-05-12 | Discharge: 2018-05-12 | Disposition: A | Discharge status: Home / Self Care | Payer: BLUE CROSS/BLUE SHIELD | Source: Ambulatory Visit | Attending: Gastroenterology | Admitting: Gastroenterology

## 2018-05-12 DIAGNOSIS — R7401 Elevation of levels of liver transaminase levels: Secondary | ICD-10-CM

## 2018-05-12 DIAGNOSIS — R740 Nonspecific elevation of levels of transaminase and lactic acid dehydrogenase [LDH]: Secondary | ICD-10-CM | POA: Diagnosis not present

## 2018-05-12 DIAGNOSIS — B1920 Unspecified viral hepatitis C without hepatic coma: Secondary | ICD-10-CM | POA: Diagnosis not present

## 2018-05-12 DIAGNOSIS — R7989 Other specified abnormal findings of blood chemistry: Secondary | ICD-10-CM | POA: Diagnosis not present

## 2018-05-12 NOTE — Telephone Encounter (Signed)
PLEASE CALL PT. HER ULTRASOUND IS NORMAL.

## 2018-05-12 NOTE — Telephone Encounter (Signed)
Pt is aware and did the [place] this morning.

## 2018-05-12 NOTE — Telephone Encounter (Signed)
Pt is aware.  

## 2018-05-13 DIAGNOSIS — Z23 Encounter for immunization: Secondary | ICD-10-CM | POA: Diagnosis not present

## 2018-05-13 NOTE — Telephone Encounter (Signed)
cc'ed to pcp °

## 2018-06-10 ENCOUNTER — Ambulatory Visit: Payer: BLUE CROSS/BLUE SHIELD | Admitting: Nutrition

## 2018-06-10 DIAGNOSIS — Z23 Encounter for immunization: Secondary | ICD-10-CM | POA: Diagnosis not present

## 2018-07-24 DIAGNOSIS — L600 Ingrowing nail: Secondary | ICD-10-CM | POA: Diagnosis not present

## 2018-07-24 DIAGNOSIS — Z6838 Body mass index (BMI) 38.0-38.9, adult: Secondary | ICD-10-CM | POA: Diagnosis not present

## 2018-07-25 DIAGNOSIS — R0683 Snoring: Secondary | ICD-10-CM | POA: Diagnosis not present

## 2018-08-06 DIAGNOSIS — G4730 Sleep apnea, unspecified: Secondary | ICD-10-CM | POA: Diagnosis not present

## 2018-08-11 DIAGNOSIS — L600 Ingrowing nail: Secondary | ICD-10-CM | POA: Diagnosis not present

## 2018-08-11 DIAGNOSIS — M79675 Pain in left toe(s): Secondary | ICD-10-CM | POA: Diagnosis not present

## 2018-08-15 DIAGNOSIS — G4733 Obstructive sleep apnea (adult) (pediatric): Secondary | ICD-10-CM | POA: Diagnosis not present

## 2018-08-25 DIAGNOSIS — L600 Ingrowing nail: Secondary | ICD-10-CM | POA: Diagnosis not present

## 2018-08-25 DIAGNOSIS — M79674 Pain in right toe(s): Secondary | ICD-10-CM | POA: Diagnosis not present

## 2018-09-03 IMAGING — DX DG ABDOMEN 2V
2 series · 2 of 2 positions shown · non-contrast
Comparison: None.

CLINICAL DATA: Chronic constipation, abdominal bloating for 5-6
years

EXAM:
ABDOMEN - 2 VIEW

[abdomen erect]
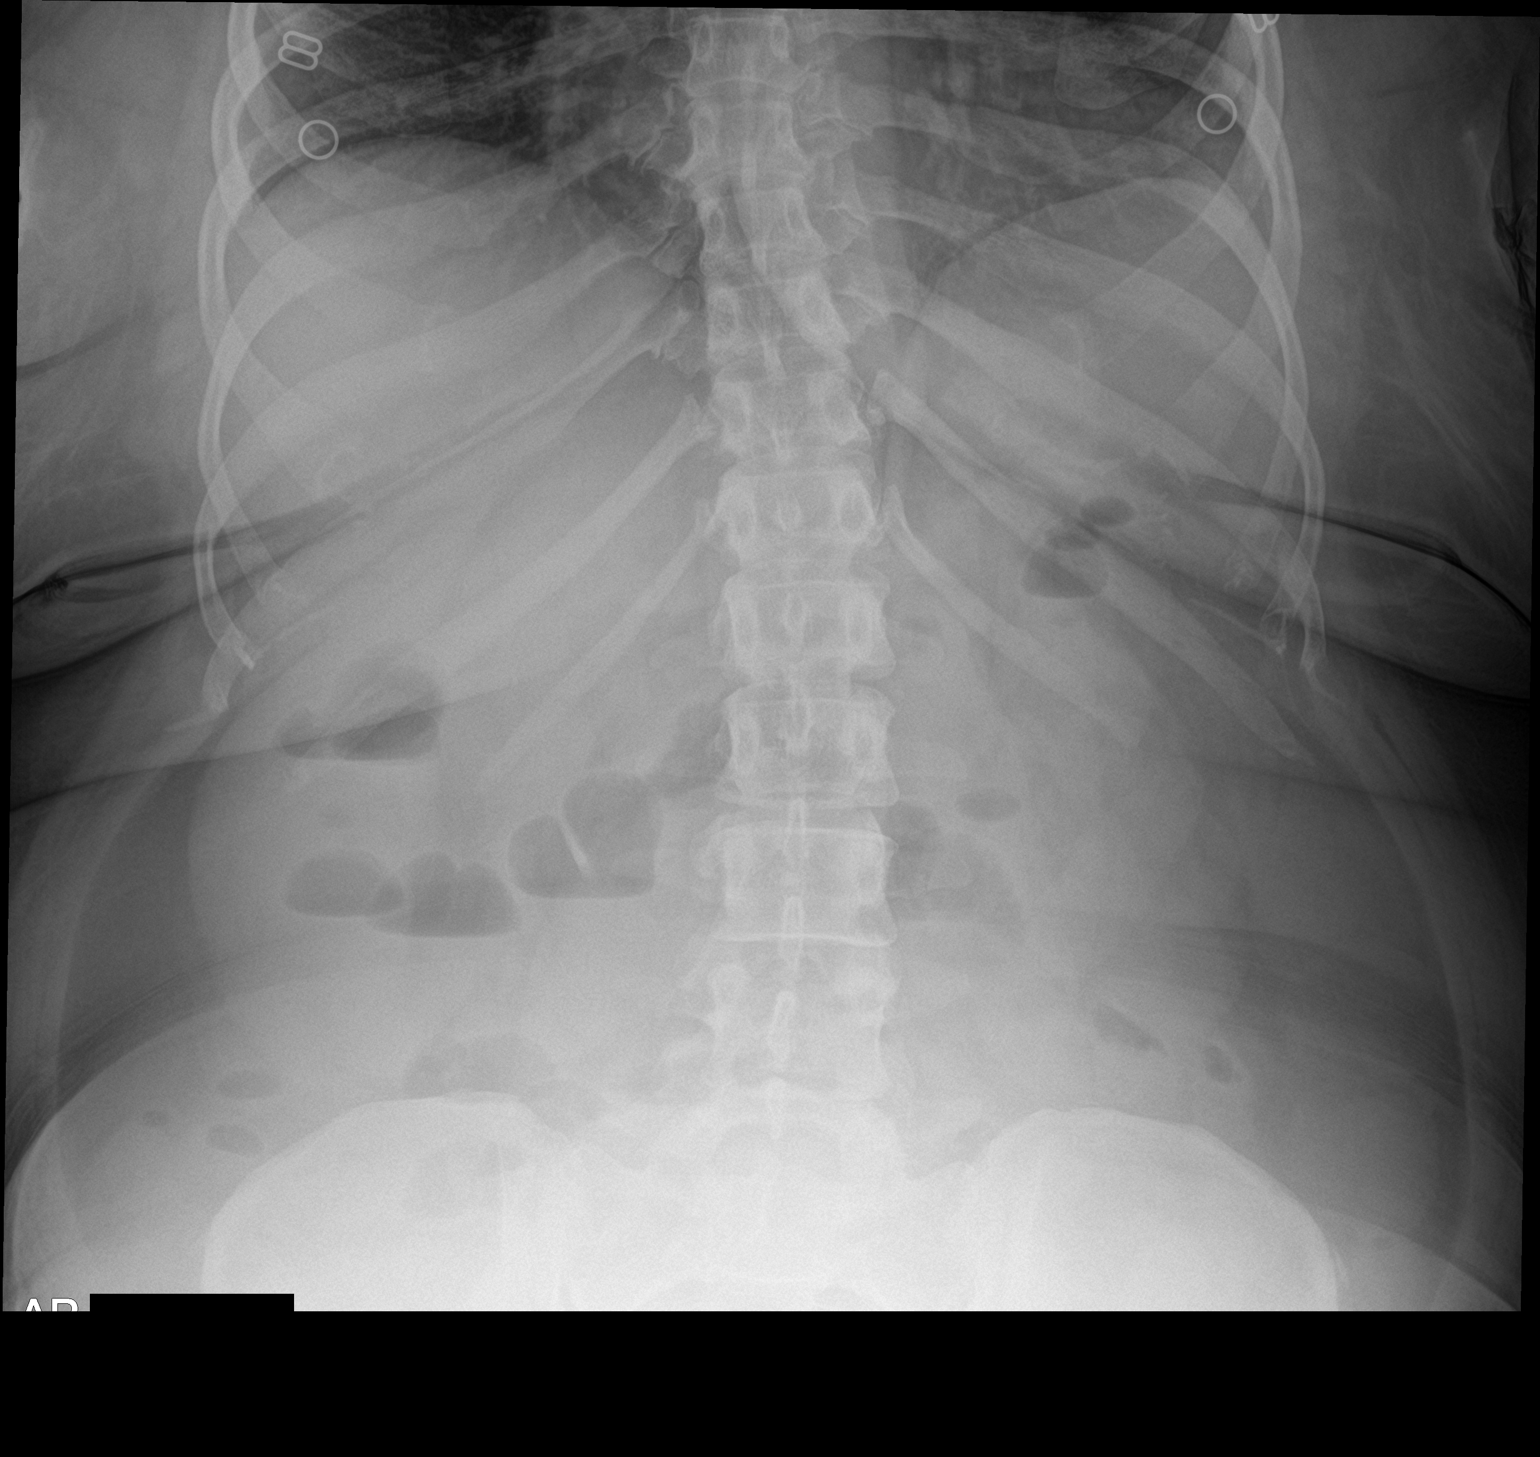

[abdomen supine]
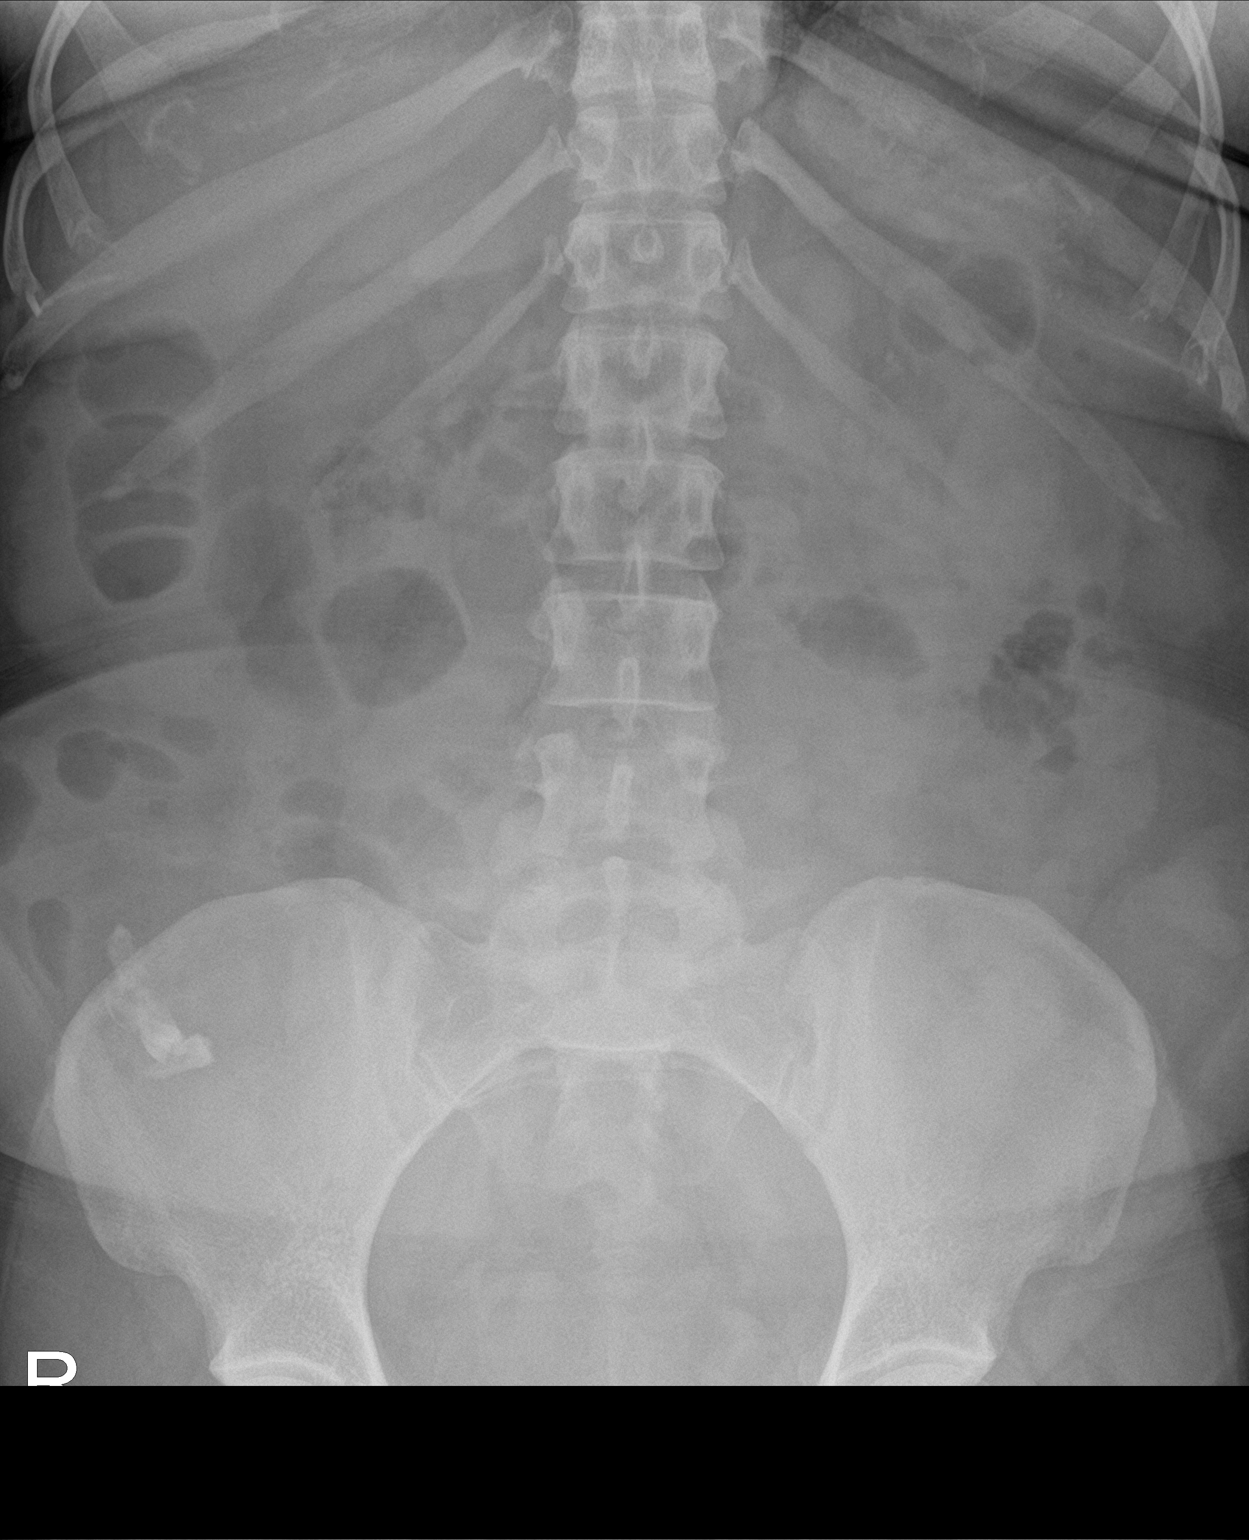

[2 of 2 positions shown; findings below may reference images not displayed]

FINDINGS: Supine and erect views of the abdomen show no bowel obstruction.
There are few scattered air-fluid levels of questionable
significance. These air-fluid levels primarily are within large
bowel which can be seen with diarrhea or recent enemas. On the erect
view, no free air is seen. No opaque calculi are noted. The bones
are unremarkable. There is calcification or foreign body overlying
the right lower quadrant, not totally visualized on the erect view,
of questionable significance.
IMPRESSION: 1. No bowel obstruction or free air. Scattered air-fluid levels are
primarily within colon which can be seen with diarrhea or recent
abdomen is.
2. No opaque calculi are noted.
3. Density in the right lower quadrant overlying the right iliac
crest of uncertain significance, possibly a foreign body within the
peritoneal cavity or bowel.

## 2018-09-10 ENCOUNTER — Telehealth: Payer: Self-pay | Admitting: Gastroenterology

## 2018-09-10 NOTE — Telephone Encounter (Signed)
[phone]  Patient called and said her insurance will not pay for her Linzess anymore.  Did not know if there was something we could do like a prior [place]..Please advise.

## 2018-09-10 NOTE — Telephone Encounter (Signed)
Left VM for pt that I have received PA paperwork and will begin the process for PA.

## 2018-09-15 ENCOUNTER — Telehealth: Payer: Self-pay

## 2018-09-15 NOTE — Telephone Encounter (Signed)
Per Rosendo Gros, pt requested samples of Linzess 290 mcg while waiting on PA. I am leaving #12 samples at front desk for pt and Rosendo Gros is informing her.

## 2018-09-16 NOTE — Telephone Encounter (Signed)
Noted  

## 2018-09-17 ENCOUNTER — Telehealth: Payer: Self-pay

## 2018-09-17 DIAGNOSIS — K5901 Slow transit constipation: Secondary | ICD-10-CM

## 2018-09-17 NOTE — Telephone Encounter (Signed)
I have received a denial for the PA for Linzess 290 mcg from [place].  Pt must try at least two of the standard laxative medication classes ( such as stimulant laxative, stool softners, enemas. When those are tried there is only one alternative medication that must be tried, Trulance. Randall Hiss, please advise!

## 2018-09-19 NOTE — Telephone Encounter (Signed)
Please convey the insurance information to the patient.  Please ask the patient if she's tried any OTC medications for constipation and if so, let me know which. At that point I can make recommendations.

## 2018-09-22 NOTE — Telephone Encounter (Signed)
PT said the only thing that she has tried OTC is Miralax. She said the insurance had been paying for it and she can't understand why it is not now. I told her she needs to try something else OTC and see if it helps. She said she will call her insurance and find out why they no longer are going to pay for the [place], that is the only thing that works for her.

## 2018-09-25 NOTE — Telephone Encounter (Signed)
I agree with her. I hope she can get resolution from them. We'll be happy to Rx Linzess if they'll cover it. Let us know what she finds out.

## 2018-09-26 NOTE — Telephone Encounter (Signed)
Noted  

## 2018-10-02 DIAGNOSIS — E669 Obesity, unspecified: Secondary | ICD-10-CM | POA: Diagnosis not present

## 2018-10-02 DIAGNOSIS — E559 Vitamin D deficiency, unspecified: Secondary | ICD-10-CM | POA: Diagnosis not present

## 2018-10-02 DIAGNOSIS — Z0000 Encounter for general adult medical examination without abnormal findings: Secondary | ICD-10-CM | POA: Diagnosis not present

## 2018-10-02 DIAGNOSIS — E785 Hyperlipidemia, unspecified: Secondary | ICD-10-CM | POA: Diagnosis not present

## 2018-10-02 DIAGNOSIS — E282 Polycystic ovarian syndrome: Secondary | ICD-10-CM | POA: Diagnosis not present

## 2018-10-02 DIAGNOSIS — R5383 Other fatigue: Secondary | ICD-10-CM | POA: Diagnosis not present

## 2018-10-02 NOTE — Telephone Encounter (Signed)
Pt has tried the doculax and colace and it causes cramping. She is willing to try the Trulance. Per Randall Hiss, have pt try one week's worth of Trulance and let us know how it does. Pt is aware and the #7 samples are at front for pick up.

## 2018-10-02 NOTE — Telephone Encounter (Signed)
I understand they're requiring a peer to peer, but can almost guarantee they'll ask what she's tried. She's only tried MiraLAX. If she hasn't tried anything else, they will likely still deny. Is she able to try other options? (dulcolax? Colace?). She'll likely need to try a sample of Trulance as well.  Let me know if she can try these, or if she just wants me to call regardless and see what they say.

## 2018-10-02 NOTE — Telephone Encounter (Signed)
Patient called and stated she spoke with her insurance company and they are requiring a peer to peer for her [place].  Please call [phone] Option 3 and Option 1  Routing to [employer] and [employer]

## 2018-10-03 NOTE — Telephone Encounter (Signed)
Reviewed

## 2018-10-06 ENCOUNTER — Encounter: Payer: Self-pay | Admitting: Gastroenterology

## 2018-10-09 MED ORDER — PLECANATIDE 3 MG PO TABS: 1.0000 | ORAL_TABLET | Freq: Every day | ORAL | 5 refills | Status: DC

## 2018-10-09 NOTE — Telephone Encounter (Signed)
Rx sent to pharmacy   

## 2018-10-09 NOTE — Telephone Encounter (Signed)
Patient called in stating the Trulance is helping her and she would like a Rx sent to [facility] in [place].

## 2018-10-09 NOTE — Telephone Encounter (Signed)
Patient notified

## 2018-10-09 NOTE — Addendum Note (Signed)
Addended by: Gordy Levan, ERIC A on: 10/09/2018 04:01 PM   Modules accepted: Orders

## 2018-10-20 DIAGNOSIS — Z111 Encounter for screening for respiratory tuberculosis: Secondary | ICD-10-CM | POA: Diagnosis not present

## 2018-11-10 ENCOUNTER — Ambulatory Visit (INDEPENDENT_AMBULATORY_CARE_PROVIDER_SITE_OTHER): Payer: Self-pay | Admitting: Internal Medicine

## 2018-11-10 DIAGNOSIS — E559 Vitamin D deficiency, unspecified: Secondary | ICD-10-CM | POA: Diagnosis not present

## 2018-11-10 DIAGNOSIS — E785 Hyperlipidemia, unspecified: Secondary | ICD-10-CM | POA: Diagnosis not present

## 2018-11-10 DIAGNOSIS — E669 Obesity, unspecified: Secondary | ICD-10-CM | POA: Diagnosis not present

## 2018-11-10 DIAGNOSIS — R7302 Impaired glucose tolerance (oral): Secondary | ICD-10-CM | POA: Diagnosis not present

## 2018-11-12 DIAGNOSIS — Z23 Encounter for immunization: Secondary | ICD-10-CM | POA: Diagnosis not present

## 2018-12-18 DIAGNOSIS — R5383 Other fatigue: Secondary | ICD-10-CM | POA: Diagnosis not present

## 2018-12-18 DIAGNOSIS — E669 Obesity, unspecified: Secondary | ICD-10-CM | POA: Diagnosis not present

## 2018-12-18 DIAGNOSIS — R7302 Impaired glucose tolerance (oral): Secondary | ICD-10-CM | POA: Diagnosis not present

## 2018-12-18 DIAGNOSIS — E559 Vitamin D deficiency, unspecified: Secondary | ICD-10-CM | POA: Diagnosis not present

## 2019-01-05 NOTE — Telephone Encounter (Signed)
Chloe Orr the patient called in stating that the Trulance is not helping with her constipation.  Please call her insurance company and see if they are willing to pay for [place].  Routing to [employer] and [employer]

## 2019-01-06 NOTE — Telephone Encounter (Signed)
Noted. We can follow-up and fill out an appeal now that she has satisfied trial/failure of 3 OTC and Trulance.

## 2019-01-07 NOTE — Telephone Encounter (Signed)
I have spoke with pt earlier this morning and got her [employer] #, but I have left a message for a return call. I need some more information.

## 2019-01-07 NOTE — Telephone Encounter (Signed)
I have submitted PA in Cover My meds for the Linzess 290 mcg.

## 2019-01-07 NOTE — Telephone Encounter (Signed)
Doris please resubmit this request via cover my meds and note the patient has failed other laxatives.

## 2019-01-08 NOTE — Telephone Encounter (Signed)
Patient is made aware we are waiting on decision from her insurance call regarding her Linzess

## 2019-01-09 NOTE — Telephone Encounter (Signed)
Received a cancellation notice in Cover My meds. I called for explanation. The recent PA that was submitted was cancelled because the previous one in Feb was still on file. They received the new info in the new PA and added that to the previous PA. I spoke to [place] W who said we need do nothing else at this point. It was sent back for review with new info added. A decision should be made by 01/14/2019. I called and left Vm for pt that decision should be made by that date and to call if questions.

## 2019-01-19 NOTE — Telephone Encounter (Signed)
Pt is aware to come by and fill out the permission paper and I am giving her samples of Linzess 290 mcg #12.

## 2019-01-19 NOTE — Telephone Encounter (Signed)
I received a call from [place] at [facility] on Friday saying the request has been denied for the Linzess 290 mcg. He said that if the pt consents that we can appeal for her if she has diagnosis of IBS-C or Chronic Idiopathic Constipation.  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 The number to call is [phone], opt 3 and opt 1.

## 2019-01-19 NOTE — Telephone Encounter (Signed)
I called [employer] and they said the recent request was cancelled because it was duplicate request with same provider. She said I will need to have pt sign a form giving Walden Field, NP permission to represent pt in reference to denial.  I spoke to [place] and she will fax me the form that needs to be completed after pt signs the permission form.

## 2019-01-19 NOTE — Telephone Encounter (Signed)
I called [employer] and spoke to [place] and she said since I have a new diagnosis I can just submit a new request on Cover My meds and mark it urgent.

## 2019-01-21 NOTE — Telephone Encounter (Signed)
I received the form from [facility] and have completed it.  I have faxed both forms to [facility].

## 2019-01-21 NOTE — Telephone Encounter (Signed)
Pt signed her form on 01/19/2019.  I still have not received the form I need to fill out and fax back with it. Chloe Orr was supposed to have faxed it).  I called [employer] and spoke to [place] and she will fax the form to me and the fax number that I submit the two forms to will be on the paperwork.

## 2019-01-27 DIAGNOSIS — E669 Obesity, unspecified: Secondary | ICD-10-CM | POA: Diagnosis not present

## 2019-01-27 DIAGNOSIS — R7302 Impaired glucose tolerance (oral): Secondary | ICD-10-CM | POA: Diagnosis not present

## 2019-01-27 DIAGNOSIS — E282 Polycystic ovarian syndrome: Secondary | ICD-10-CM | POA: Diagnosis not present

## 2019-01-27 NOTE — Telephone Encounter (Signed)
I called [employer] to check on the status of the Appeals # [id number]. I spoke to [place] and she said it was received on 01/21/2019. They have 30 days to work on approving or denying.

## 2019-02-09 ENCOUNTER — Other Ambulatory Visit: Payer: Self-pay

## 2019-02-09 DIAGNOSIS — R6889 Other general symptoms and signs: Secondary | ICD-10-CM | POA: Diagnosis not present

## 2019-02-09 DIAGNOSIS — Z20822 Contact with and (suspected) exposure to covid-19: Secondary | ICD-10-CM

## 2019-02-11 LAB — NOVEL CORONAVIRUS, NAA: SARS-CoV-2, NAA: NOT DETECTED

## 2019-02-12 NOTE — Telephone Encounter (Signed)
Chloe Orr called from [facility]. She received patient appeal but states she does not have any records. She asked if you would fax the last 2 OV notes to her at [phone] appeal ID# [id number].

## 2019-02-12 NOTE — Telephone Encounter (Signed)
The last 2 office visit notes have been faxed.

## 2019-02-18 ENCOUNTER — Other Ambulatory Visit: Payer: Self-pay | Admitting: Nurse Practitioner

## 2019-02-18 DIAGNOSIS — K5900 Constipation, unspecified: Secondary | ICD-10-CM

## 2019-02-19 DIAGNOSIS — E7849 Other hyperlipidemia: Secondary | ICD-10-CM | POA: Diagnosis not present

## 2019-02-19 DIAGNOSIS — Z1389 Encounter for screening for other disorder: Secondary | ICD-10-CM | POA: Diagnosis not present

## 2019-02-19 DIAGNOSIS — R7309 Other abnormal glucose: Secondary | ICD-10-CM | POA: Diagnosis not present

## 2019-02-19 DIAGNOSIS — Z6841 Body Mass Index (BMI) 40.0 and over, adult: Secondary | ICD-10-CM | POA: Diagnosis not present

## 2019-02-19 DIAGNOSIS — R8290 Unspecified abnormal findings in urine: Secondary | ICD-10-CM | POA: Diagnosis not present

## 2019-02-19 NOTE — Telephone Encounter (Signed)
I called to check on status of Appeal. Spoke with Gemme D. Per Gemme, it has been overturned and will be approved. We should be receiving something in the next few days either by fax or mail. Reference # for this call is [id number].  LMOM for pt.

## 2019-02-23 NOTE — Telephone Encounter (Signed)
Received fax from [place] was approved for Linzess. I have faxed the notice to the pharmacy and pt is aware.

## 2019-02-26 ENCOUNTER — Other Ambulatory Visit (HOSPITAL_COMMUNITY): Payer: Self-pay | Admitting: Family Medicine

## 2019-02-26 DIAGNOSIS — Z1231 Encounter for screening mammogram for malignant neoplasm of breast: Secondary | ICD-10-CM

## 2019-03-09 ENCOUNTER — Ambulatory Visit (HOSPITAL_COMMUNITY)
Admission: RE | Admit: 2019-03-09 | Discharge: 2019-03-09 | Disposition: A | Discharge status: Home / Self Care | Payer: BLUE CROSS/BLUE SHIELD | Source: Ambulatory Visit | Attending: Family Medicine | Admitting: Family Medicine

## 2019-03-09 ENCOUNTER — Other Ambulatory Visit: Payer: Self-pay

## 2019-03-09 ENCOUNTER — Encounter (HOSPITAL_COMMUNITY): Payer: Self-pay

## 2019-03-09 DIAGNOSIS — Z1231 Encounter for screening mammogram for malignant neoplasm of breast: Secondary | ICD-10-CM | POA: Diagnosis not present

## 2019-03-09 IMAGING — US US ABDOMEN COMPLETE
1 series · 14 of 25 positions shown · non-contrast
Comparison: None.

CLINICAL DATA: Elevated liver function studies.

EXAM:
ABDOMEN ULTRASOUND COMPLETE

[Series 1: us abdomen complete · 14 of 128 slices shown]
[im 1/128]
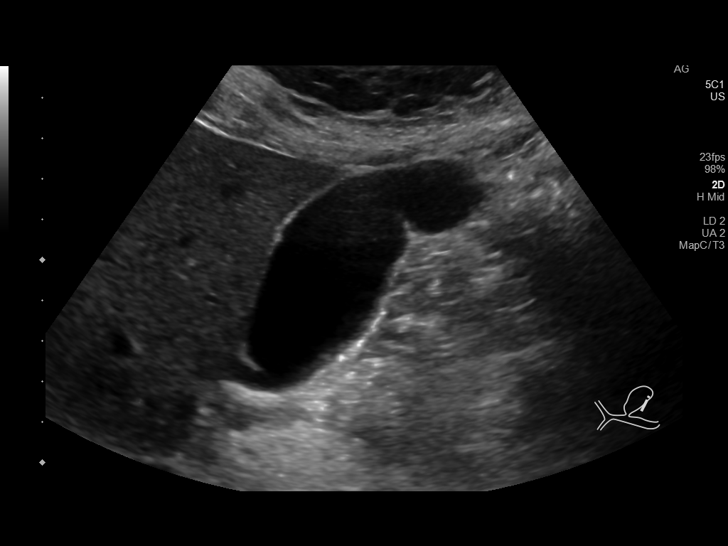
[im 11/128]
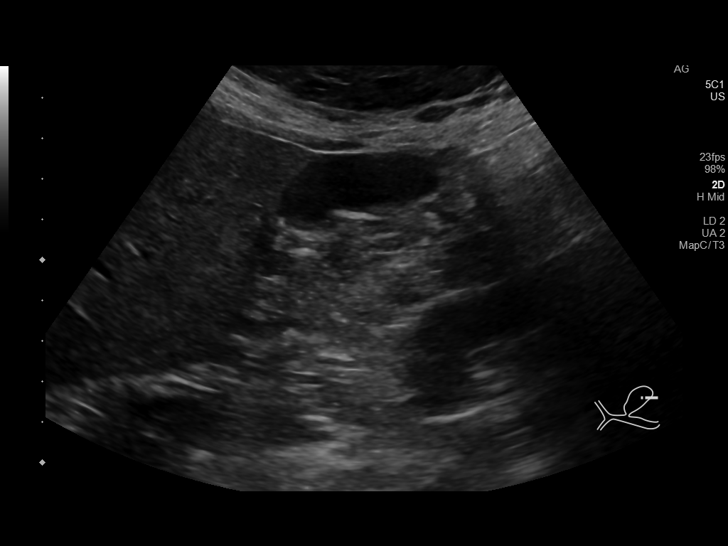
[im 22/128]
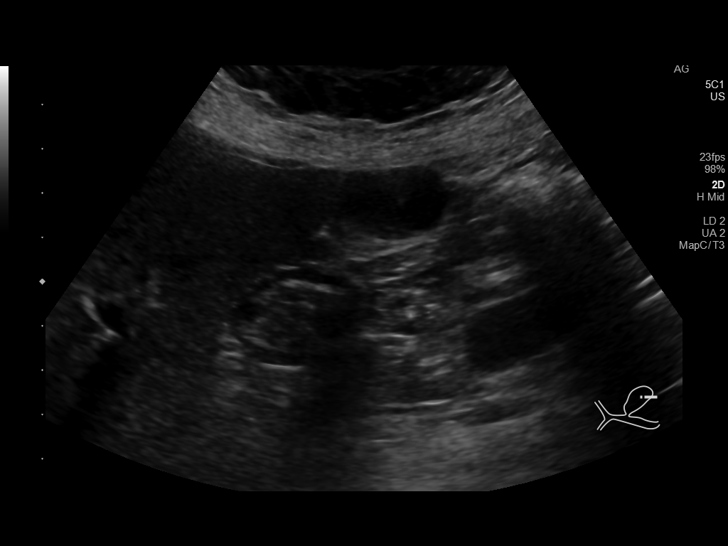
[im 32/128]
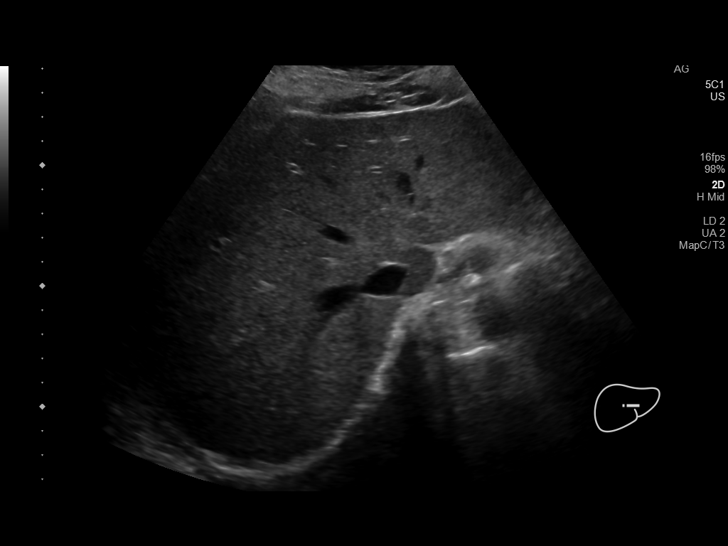
[im 43/128]
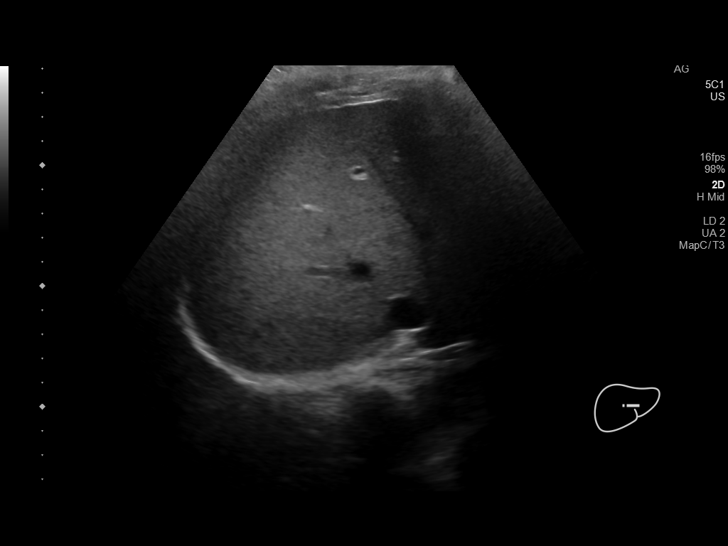
[im 48/128]
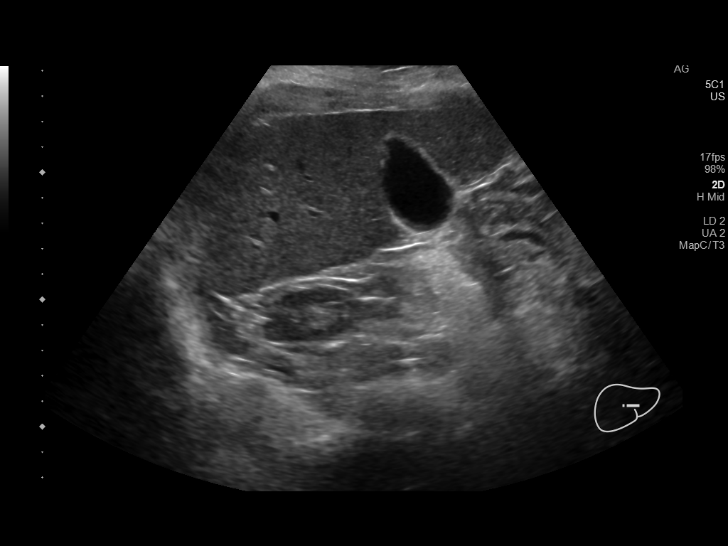
[im 59/128]
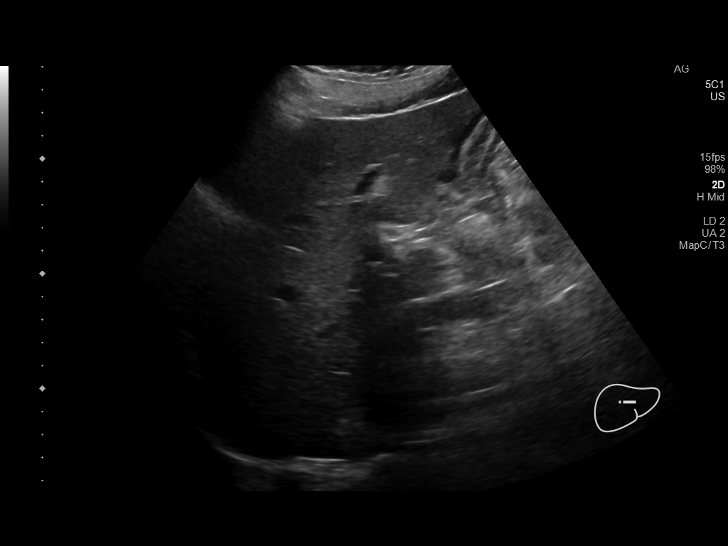
[im 69/128]
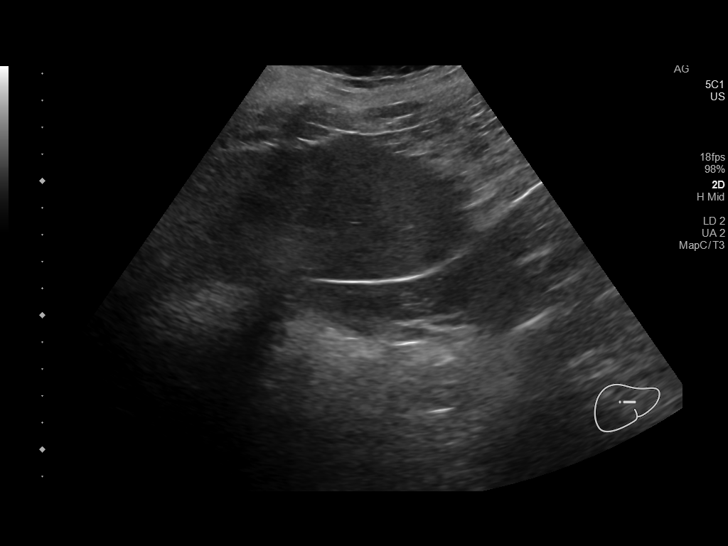
[im 80/128]
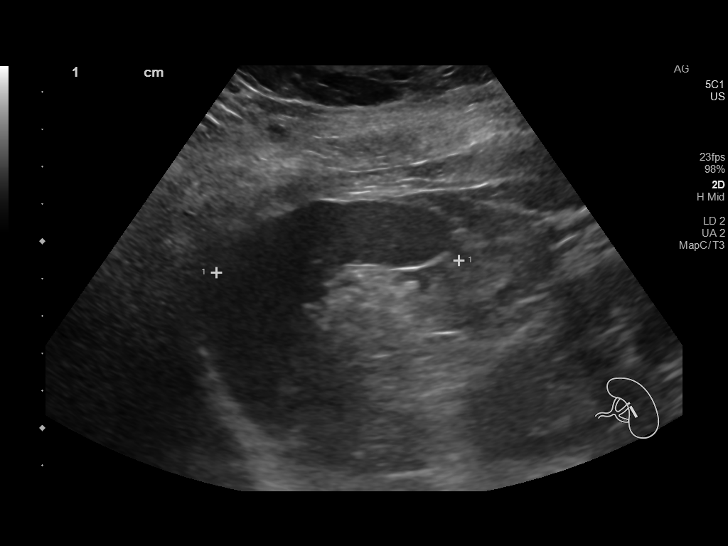
[im 85/128]
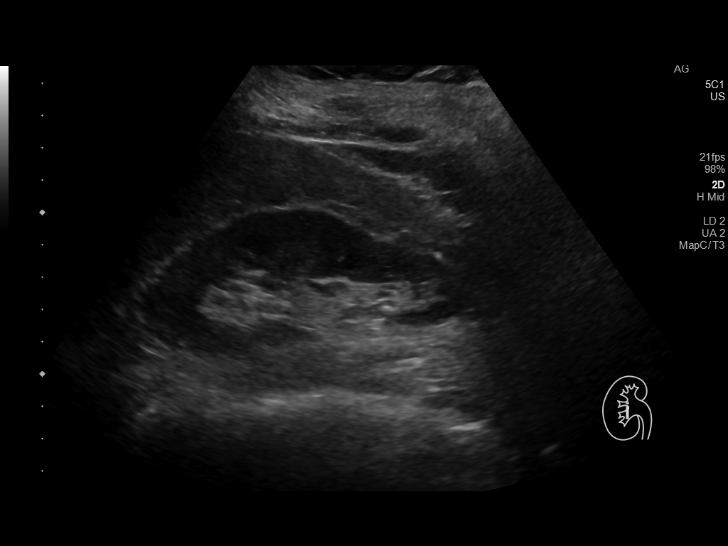
[im 96/128]
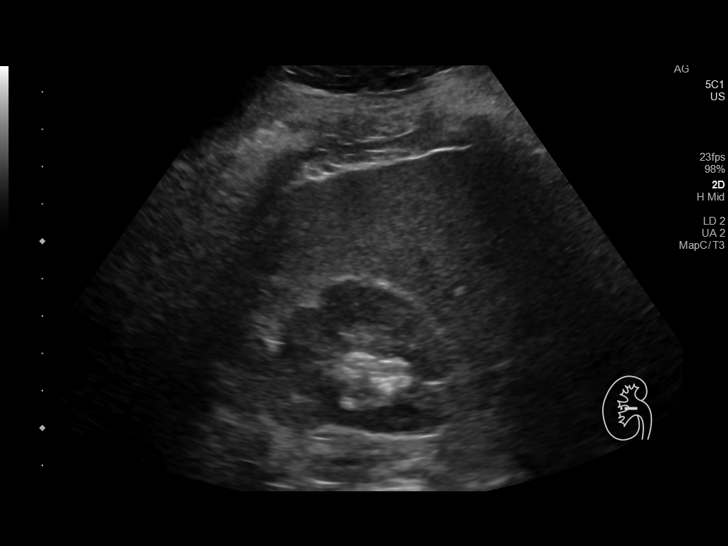
[im 106/128]
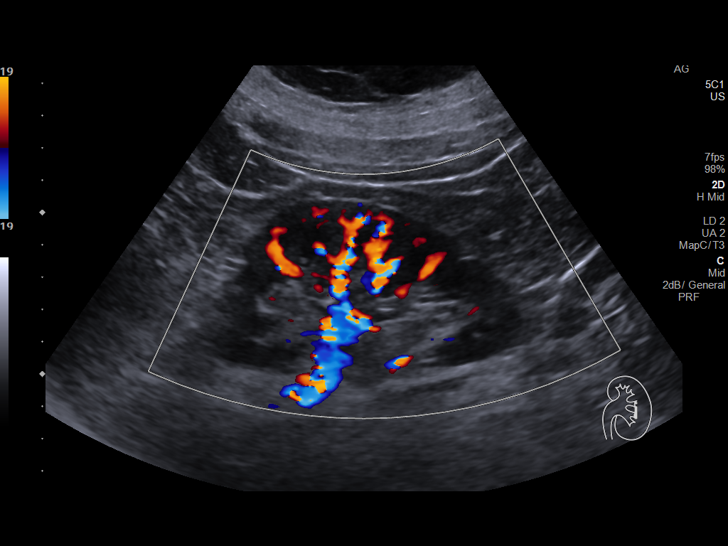
[im 117/128]
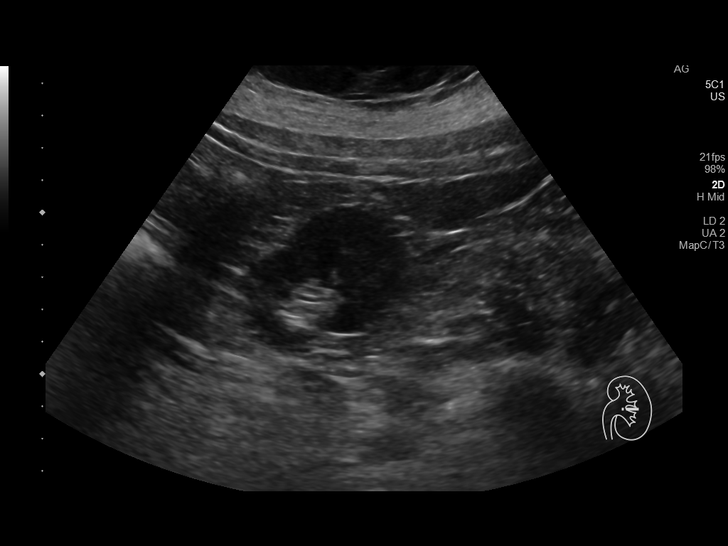
[im 128/128]
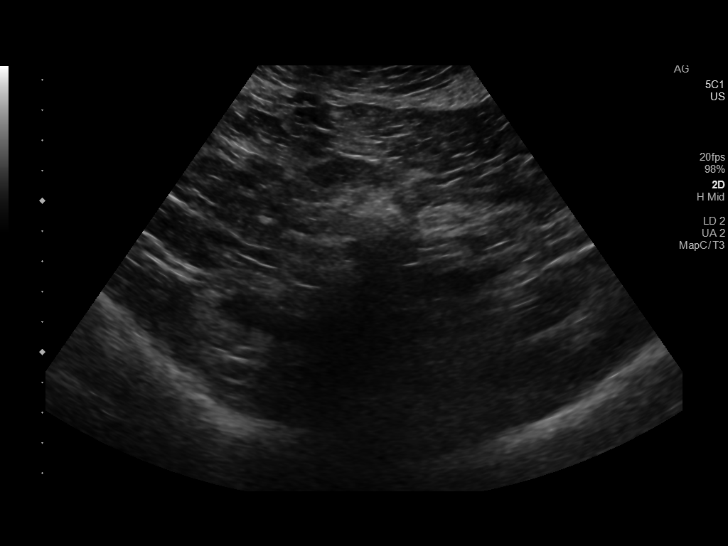

[14 of 25 positions shown; findings below may reference images not displayed]

FINDINGS: Gallbladder: No gallstones or wall thickening visualized. No
sonographic Murphy sign noted by sonographer.

Common bile duct: Diameter: 2.0 mm

Liver: Normal echogenicity without focal lesion or biliary
dilatation. Portal vein is patent on color Doppler imaging with
normal direction of blood flow towards the liver.

IVC: Normal caliber.

Pancreas: Sonographically normal.

Spleen: Size and appearance within normal limits.

Right Kidney: Length: 10.5 cm. Normal renal cortical thickness and
echogenicity without focal lesions or hydronephrosis.

Left Kidney: Length: 10.4 cm. Normal renal cortical thickness and
echogenicity without focal lesions or hydronephrosis.

Abdominal aorta: Normal caliber.

Other findings: None.
IMPRESSION: Normal abdominal ultrasound examination.

## 2019-03-23 DIAGNOSIS — R5383 Other fatigue: Secondary | ICD-10-CM | POA: Diagnosis not present

## 2019-03-23 DIAGNOSIS — E559 Vitamin D deficiency, unspecified: Secondary | ICD-10-CM | POA: Diagnosis not present

## 2019-03-23 DIAGNOSIS — E669 Obesity, unspecified: Secondary | ICD-10-CM | POA: Diagnosis not present

## 2019-03-23 DIAGNOSIS — E785 Hyperlipidemia, unspecified: Secondary | ICD-10-CM | POA: Diagnosis not present

## 2019-03-23 DIAGNOSIS — R7302 Impaired glucose tolerance (oral): Secondary | ICD-10-CM | POA: Diagnosis not present

## 2019-06-12 DIAGNOSIS — Z6841 Body Mass Index (BMI) 40.0 and over, adult: Secondary | ICD-10-CM | POA: Diagnosis not present

## 2019-06-12 DIAGNOSIS — L918 Other hypertrophic disorders of the skin: Secondary | ICD-10-CM | POA: Diagnosis not present

## 2019-06-25 ENCOUNTER — Ambulatory Visit (INDEPENDENT_AMBULATORY_CARE_PROVIDER_SITE_OTHER): Payer: BLUE CROSS/BLUE SHIELD | Admitting: Internal Medicine

## 2019-10-01 DIAGNOSIS — Z23 Encounter for immunization: Secondary | ICD-10-CM | POA: Diagnosis not present

## 2019-10-09 DIAGNOSIS — Z6841 Body Mass Index (BMI) 40.0 and over, adult: Secondary | ICD-10-CM | POA: Diagnosis not present

## 2019-10-27 DIAGNOSIS — Z23 Encounter for immunization: Secondary | ICD-10-CM | POA: Diagnosis not present

## 2019-12-17 DIAGNOSIS — F913 Oppositional defiant disorder: Secondary | ICD-10-CM | POA: Diagnosis not present

## 2020-01-04 IMAGING — MG DIGITAL SCREENING BILATERAL MAMMOGRAM WITH TOMO AND CAD
6 of 10 series · 6 of 30 positions shown · non-contrast
Comparison: Previous exam(s), presurgical.

CLINICAL DATA: Screening. History of bilateral reduction
mammoplasty.

EXAM:
DIGITAL SCREENING BILATERAL MAMMOGRAM WITH TOMO AND CAD

[L CC synth-2D]
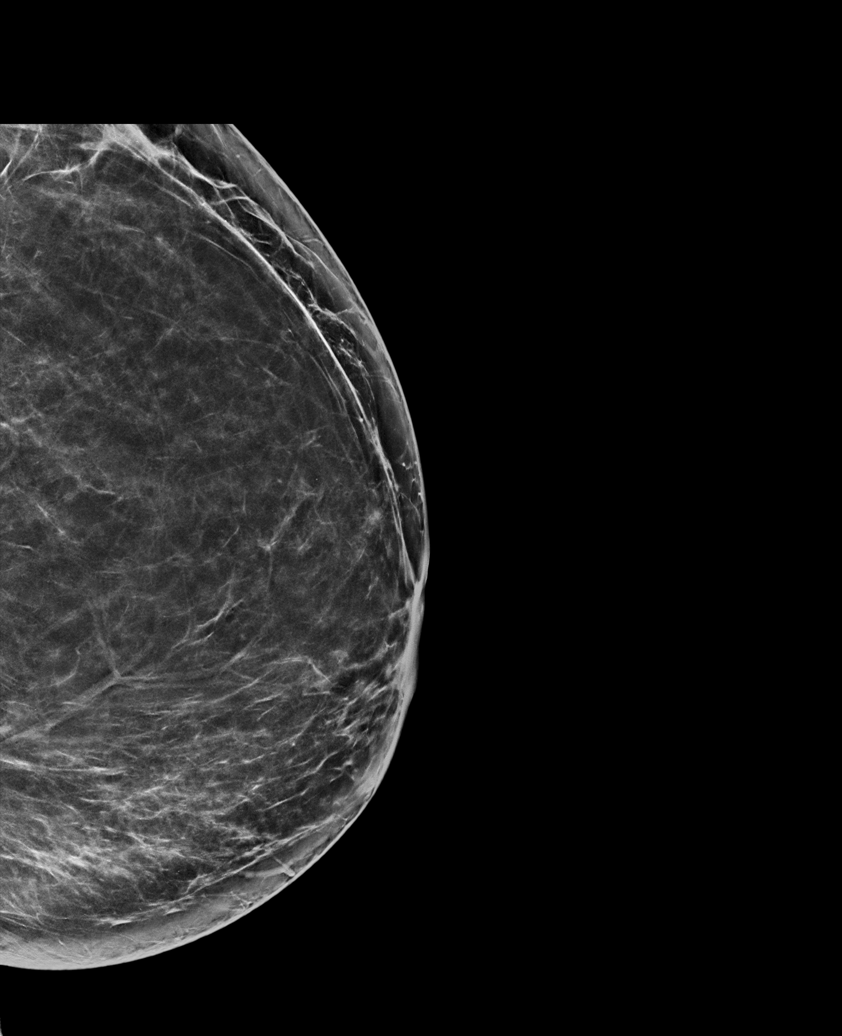

[L MLO synth-2D]
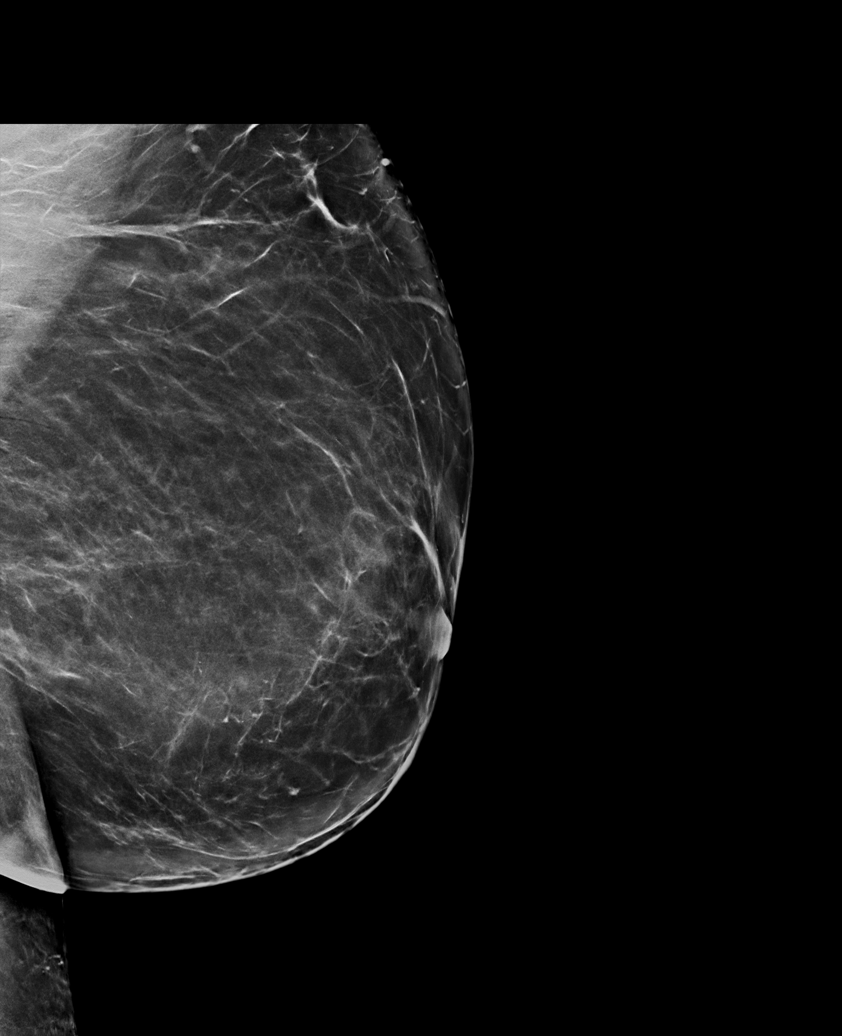

[R MLO synth-2D (1 of 2)]
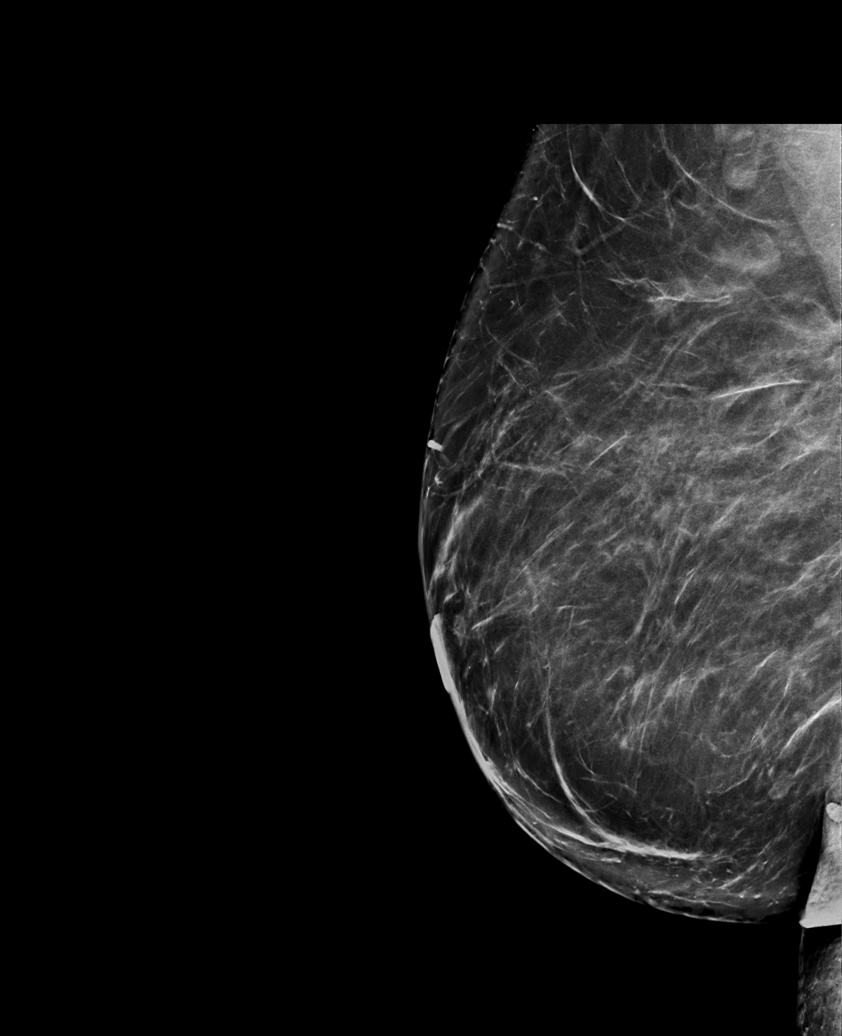

[R CC synth-2D]
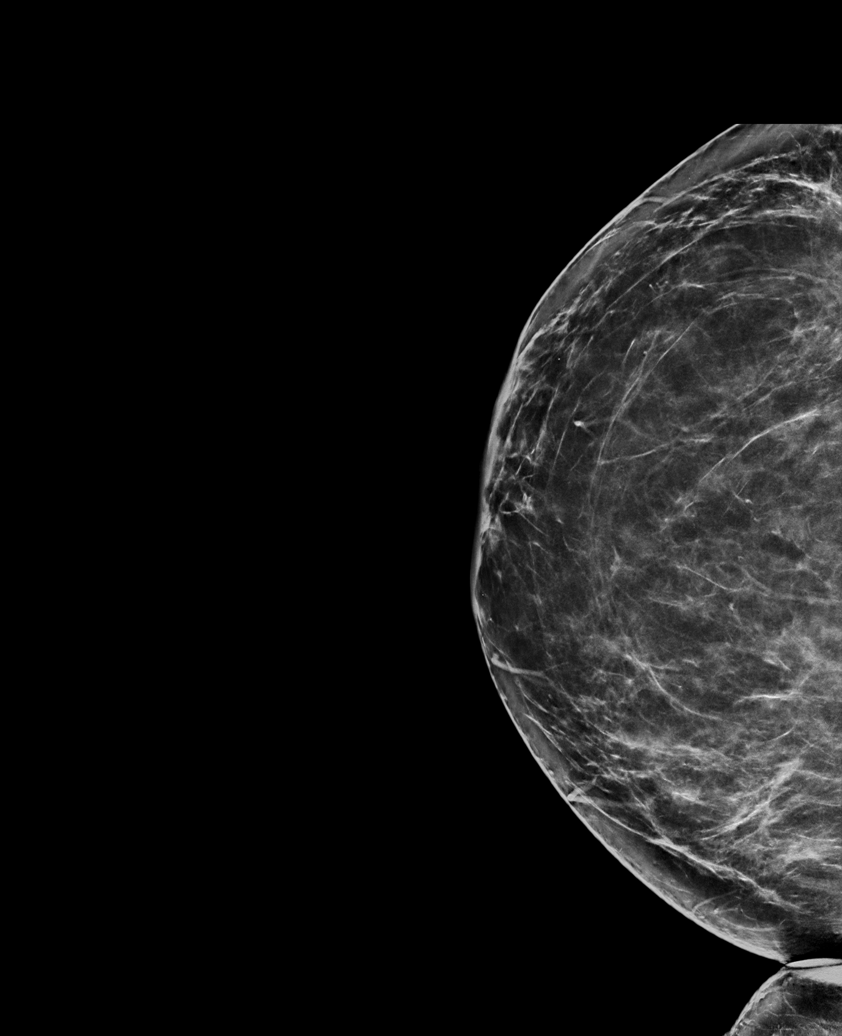

[R MLO synth-2D (2 of 2)]
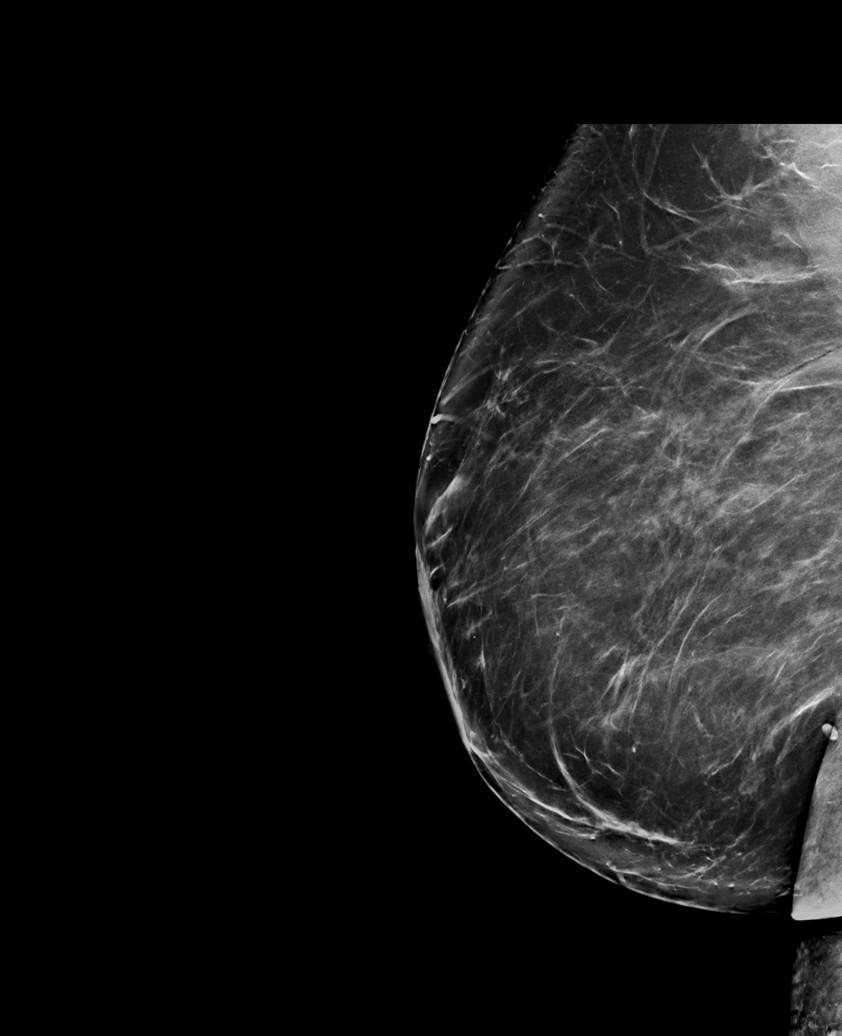

[L MLO tomo · tomo slice 49/97.0]
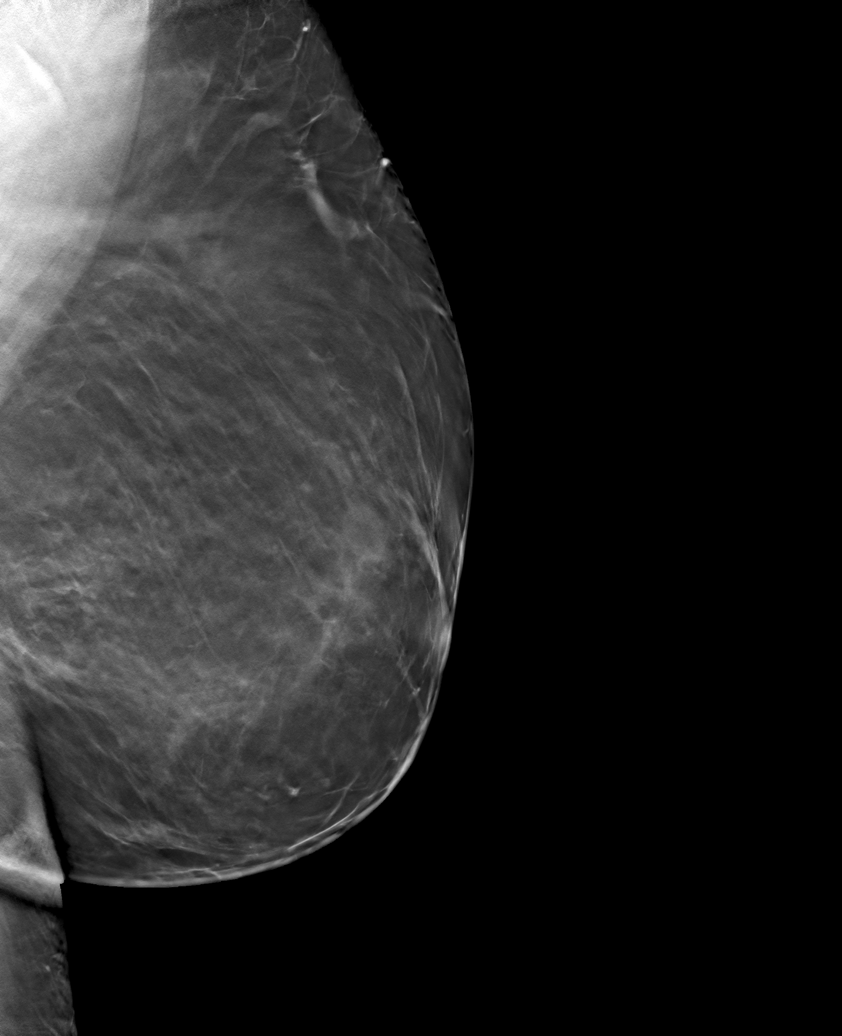

[6 of 30 positions shown; findings below may reference images not displayed]

ACR Breast Density Category b: There are scattered areas of
fibroglandular density.
FINDINGS: There are no findings suspicious for malignancy. Images were
processed with CAD.
IMPRESSION: No mammographic evidence of malignancy. A result letter of this
screening mammogram will be mailed directly to the patient.

RECOMMENDATION:
Screening mammogram in one year. (Code:SN-7-1AL)

BI-RADS CATEGORY  1: Negative.

## 2020-03-15 ENCOUNTER — Other Ambulatory Visit: Payer: Self-pay | Admitting: Gastroenterology

## 2020-03-15 DIAGNOSIS — K5900 Constipation, unspecified: Secondary | ICD-10-CM

## 2020-03-16 NOTE — Telephone Encounter (Signed)
Last refill before office visit. Needs office visit before additional ones.

## 2020-03-17 ENCOUNTER — Encounter: Payer: Self-pay | Admitting: Gastroenterology

## 2020-03-17 NOTE — Telephone Encounter (Signed)
Sent letter to call and make appointment  

## 2020-03-30 ENCOUNTER — Telehealth: Payer: Self-pay

## 2020-03-30 NOTE — Telephone Encounter (Signed)
Received an approval letter from [facility]. Linzess 290 mcg has been approved through 03/23/2021. Left a detailed message for pt. Approval letter will be scanned in chart.

## 2020-05-06 DIAGNOSIS — Z6841 Body Mass Index (BMI) 40.0 and over, adult: Secondary | ICD-10-CM | POA: Diagnosis not present

## 2020-05-12 ENCOUNTER — Telehealth: Payer: Self-pay | Admitting: *Deleted

## 2020-05-12 NOTE — Telephone Encounter (Signed)
Pt consented to a virtual visit on 05/17/2020.

## 2020-05-12 NOTE — Telephone Encounter (Signed)
Chloe Orr, you are scheduled for a virtual visit with your provider today.  Just as we do with appointments in the office, we must obtain your consent to participate.  Your consent will be active for this visit and any virtual visit you may have with one of our providers in the next 365 days.  If you have a MyChart account, I can also send a copy of this consent to you electronically.  All virtual visits are billed to your insurance company just like a traditional visit in the office.  As this is a virtual visit, video technology does not allow for your provider to perform a traditional examination.  This may limit your provider's ability to fully assess your condition.  If your provider identifies any concerns that need to be evaluated in person or the need to arrange testing such as labs, EKG, etc, we will make arrangements to do so.  Although advances in technology are sophisticated, we cannot ensure that it will always work on either your end or our end.  If the connection with a video visit is poor, we may have to switch to a telephone visit.  With either a video or telephone visit, we are not always able to ensure that we have a secure connection.   I need to obtain your verbal consent now.   Are you willing to proceed with your visit today?

## 2020-05-17 ENCOUNTER — Telehealth (INDEPENDENT_AMBULATORY_CARE_PROVIDER_SITE_OTHER): Payer: BLUE CROSS/BLUE SHIELD | Admitting: Gastroenterology

## 2020-05-17 ENCOUNTER — Encounter: Payer: Self-pay | Admitting: Gastroenterology

## 2020-05-17 DIAGNOSIS — R7401 Elevation of levels of liver transaminase levels: Secondary | ICD-10-CM | POA: Diagnosis not present

## 2020-05-17 DIAGNOSIS — K5900 Constipation, unspecified: Secondary | ICD-10-CM

## 2020-05-17 MED ORDER — LINACLOTIDE 290 MCG PO CAPS: ORAL_CAPSULE | ORAL | 3 refills | Status: DC

## 2020-05-17 NOTE — Patient Instructions (Addendum)
1. Please have your labs done at [facility] at your convenience. We will contact you with results as available.  2. Linzess 243mcg daily has been approved by [employer] and RX sent to your pharmacy.  3. Return to the office in one year. Consider scheduling screening colonoscopy at that time (age 44).

## 2020-05-17 NOTE — Progress Notes (Signed)
Primary Care Physician:  Jake Samples, PA-C Primary GI:  Elon Alas. Abbey Chatters, DO   Patient Location: Parked car Provider Location: [place] office  Reason for Visit:  Chief Complaint  Patient presents with  . Constipation    been out of linzess x2 months as insurance will not pay for it. Did come by 1 month ago to get sample but has ran out    Persons present on the virtual encounter, with roles: Patient, myself (provider),Mindy Estudillo CMA (updated meds and allergies)  Total time (minutes) spent on medical discussion: 10 minutes  Due to COVID-19, visit was conducted using Mychart video method.  Visit was requested by patient.  Virtual Visit via Mychart video  I connected with Chloe Orr on 05/17/20 at 11:30 AM EDT by Mychart video and verified that I am speaking with the correct person using two identifiers.   I discussed the limitations, risks, security and privacy concerns of performing an evaluation and management service by telephone/video and the availability of in person appointments. I also discussed with the patient that there may be a patient responsible charge related to this service. The patient expressed understanding and agreed to proceed.   HPI:   Chloe Orr is a 44 y.o. female who presents for virtual visit regarding constipation and abnormal LFTs.   Does well with constipation as long as she stays on Linzess. Ran out of medication while trying to get it covered by insurance.  Finally got approval letter last month but she was unaware and has not picked up her prescription.  Linzess really helps her a lot keeping her regular. BMs not regular or effective without it. Denies melena or rectal bleeding.  No abdominal pain.  No upper GI symptoms.  History of abnormal LFTs but has not had any follow-up since 2019. U/S with normal liver. Autoimmune hepatitis work up negative in 04/2018. Viral markers negative for Hep B and C in 2019. LFTs in 03/2018,  AST 56, ALT 101.     Current Outpatient Medications  Medication Sig Dispense Refill  . Fish Oil-Cholecalciferol (FISH OIL + D3 PO) Take 1 capsule by mouth at bedtime.    Marland Kitchen rOPINIRole (REQUIP) 1 MG tablet Take 1 mg by mouth at bedtime.   1  . simvastatin (ZOCOR) 20 MG tablet Take 20 mg by mouth at bedtime.    Marland Kitchen linaclotide (LINZESS) 290 MCG CAPS capsule TAKE 1 CAPSULE BY MOUTH ONCE DAILY WITH BREAKFAST 90 capsule 3   No current facility-administered medications for this visit.    Past Medical History:  Diagnosis Date  . Fibroid uterus   . Hypercholesteremia     Past Surgical History:  Procedure Laterality Date  . BREAST REDUCTION SURGERY Bilateral 09/20/2016   [facility]  . [place], 2010   [facility]  . MASS EXCISION  2011   right arm mass excision  . MASS EXCISION  02/11/2012   Procedure: EXCISION MASS;  Surgeon: Jamesetta So, MD;  Location: [facility];  Service: General;  Laterality: Left;  Excision Neoplasm Left Axilla  . REDUCTION MAMMAPLASTY Bilateral   . SALPINGOOPHORECTOMY Right 12/24/2017   Procedure: RIGHT SALPINGO OOPHORECTOMY;  Surgeon: [name], MD;  Location: [facility];  Service: Gynecology;  Laterality: Right;  . SUPRACERVICAL ABDOMINAL HYSTERECTOMY N/A 12/24/2017   Procedure: HYSTERECTOMY SUPRACERVICAL ABDOMINAL;  Surgeon: [name], MD;  Location: [facility];  Service: Gynecology;  Laterality: N/A;  . TUBAL LIGATION  Family History  Problem Relation Age of Onset  . Diabetes Father   . Birth defects Sister        cleft lip   . Colon cancer Neg Hx   . Gastric cancer Neg Hx   . Esophageal cancer Neg Hx     Social History   Socioeconomic History  . Marital status: Single    Spouse name: Not on file  . Number of children: Not on file  . Years of education: Not on file  . Highest education level: Not on file  Occupational History  . Not on file  Tobacco Use  . Smoking status: Never Smoker  . Smokeless tobacco: Never Used  Vaping  Use  . Vaping Use: Never used  Substance and Sexual Activity  . Alcohol use: Yes    Comment: Currently (11/05/17): occasional glass of wine. Previously 3 beer daily, stopped drinking beer 10/21/17.  . Drug use: No  . Sexual activity: Not Currently    Birth control/protection: Surgical    Comment: supracervical hyst  Other Topics Concern  . Not on file  Social History Narrative   STILL WORKING AS CNA. ONE DAUGHTER: BACK AT HOME, GRADUATED FROM [employer] YOUNGEST IN GYMANASTICS.   Social Determinants of Health   Financial Resource Strain:   . Difficulty of Paying Living Expenses: Not on file  Food Insecurity:   . Worried About Charity fundraiser in the Last Year: Not on file  . Ran Out of Food in the Last Year: Not on file  Transportation Needs:   . Lack of Transportation (Medical): Not on file  . Lack of Transportation (Non-Medical): Not on file  Physical Activity:   . Days of Exercise per Week: Not on file  . Minutes of Exercise per Session: Not on file  Stress:   . Feeling of Stress : Not on file  Social Connections:   . Frequency of Communication with Friends and Family: Not on file  . Frequency of Social Gatherings with Friends and Family: Not on file  . Attends Religious Services: Not on file  . Active Member of Clubs or Organizations: Not on file  . Attends Archivist Meetings: Not on file  . Marital Status: Not on file  Intimate Partner Violence:   . Fear of Current or Ex-Partner: Not on file  . Emotionally Abused: Not on file  . Physically Abused: Not on file  . Sexually Abused: Not on file      ROS:  General: Negative for anorexia, weight loss, fever, chills, fatigue, weakness. Eyes: Negative for vision changes.  ENT: Negative for hoarseness, difficulty swallowing , nasal congestion. CV: Negative for chest pain, angina, palpitations, dyspnea on exertion, peripheral edema.  Respiratory: Negative for dyspnea at rest, dyspnea on exertion, cough, sputum,  wheezing.  GI: See history of present illness. GU:  Negative for dysuria, hematuria, urinary incontinence, urinary frequency, nocturnal urination.  MS: Negative for joint pain, low back pain.  Derm: Negative for rash or itching.  Neuro: Negative for weakness, abnormal sensation, seizure, frequent headaches, memory loss, confusion.  Psych: Negative for anxiety, depression, suicidal ideation, hallucinations.  Endo: Negative for unusual weight change.  Heme: Negative for bruising or bleeding. Allergy: Negative for rash or hives.   Observations/Objective: Pleasant well nourished well developed female in NAD. Otherwise exam unavailable.   Assessment and Plan: Pleasant 44 y/o female with history of abnormal LFTS and constipation presenting for follow up.   Constipation: does well as long  as stays on Linzess. Continue Linzess 231mcg daily. BCBS has approved for one year. RX sent to pharmacy. Call with questions or concerns. Return in one year. Consider colonoscopy at age 60.   Abnormal LFTS: update labs today. Further recommendations to follow.   Follow Up Instructions:    I discussed the assessment and treatment plan with the patient. The patient was provided an opportunity to ask questions and all were answered. The patient agreed with the plan and demonstrated an understanding of the instructions. AVS mailed to patient's home address.   The patient was advised to call back or seek an in-person evaluation if the symptoms worsen or if the condition fails to improve as anticipated.  I provided 10 minutes minutes of virtual face-to-face time during this encounter.   Neil Crouch, PA-C

## 2020-05-17 NOTE — Progress Notes (Signed)
CC'ED TO PCP 

## 2020-09-29 DIAGNOSIS — Z6839 Body mass index (BMI) 39.0-39.9, adult: Secondary | ICD-10-CM | POA: Diagnosis not present

## 2020-09-29 DIAGNOSIS — Z1389 Encounter for screening for other disorder: Secondary | ICD-10-CM | POA: Diagnosis not present

## 2020-10-27 ENCOUNTER — Ambulatory Visit
Admission: EM | Admit: 2020-10-27 | Discharge: 2020-10-27 | Discharge status: Absent without leave | Payer: BLUE CROSS/BLUE SHIELD

## 2020-10-31 DIAGNOSIS — Z111 Encounter for screening for respiratory tuberculosis: Secondary | ICD-10-CM | POA: Diagnosis not present

## 2021-04-03 ENCOUNTER — Encounter: Payer: Self-pay | Admitting: Internal Medicine

## 2021-05-30 ENCOUNTER — Telehealth: Payer: Self-pay | Admitting: Internal Medicine

## 2021-05-30 ENCOUNTER — Other Ambulatory Visit: Payer: Self-pay | Admitting: Gastroenterology

## 2021-05-30 DIAGNOSIS — K5900 Constipation, unspecified: Secondary | ICD-10-CM

## 2021-05-30 NOTE — Telephone Encounter (Signed)
[phone] patient called and said she turned 45 and needs a tcs.  Nurse or office for her?

## 2021-05-30 NOTE — Telephone Encounter (Signed)
Please offer nurse visit by phone.  Thanks!

## 2021-07-11 ENCOUNTER — Other Ambulatory Visit: Payer: Self-pay

## 2021-07-11 ENCOUNTER — Ambulatory Visit (INDEPENDENT_AMBULATORY_CARE_PROVIDER_SITE_OTHER): Payer: BLUE CROSS/BLUE SHIELD | Admitting: *Deleted

## 2021-07-11 ENCOUNTER — Encounter: Payer: Self-pay | Admitting: *Deleted

## 2021-07-11 VITALS — Ht 59.0 in | Wt 180.0 lb

## 2021-07-11 DIAGNOSIS — Z1211 Encounter for screening for malignant neoplasm of colon: Secondary | ICD-10-CM

## 2021-07-11 MED ORDER — CLENPIQ 10-3.5-12 MG-GM -GM/160ML PO SOLN: 1.0000 | Freq: Once | ORAL | 0 refills | Status: AC

## 2021-07-11 NOTE — Progress Notes (Signed)
Pt to call me back with insurance information.

## 2021-07-11 NOTE — Progress Notes (Signed)
OK to schedule. ASA II 

## 2021-07-11 NOTE — Progress Notes (Addendum)
Gastroenterology Pre-Procedure Review  Request Date: 07/11/2021 Requesting Physician: Due for TCS at age 45 per last ov note 05/17/2020, no previous TCS  PATIENT REVIEW QUESTIONS: The patient responded to the following health history questions as indicated:    1. Diabetes Melitis: no 2. Joint replacements in the past 12 months: no 3. Major health problems in the past 3 months: no 4. Has an artificial valve or MVP: no 5. Has a defibrillator: no 6. Has been advised in past to take antibiotics in advance of a procedure like teeth cleaning: no 7. Family history of colon cancer: no  8. Alcohol Use: yes, 1 drink a month 9. Illicit drug Use: no 10. History of sleep apnea: yes, CPAP  11. History of coronary artery or other vascular stents placed within the last 12 months: no 12. History of any prior anesthesia complications: no 13. Body mass index is 36.36 kg/m.    MEDICATIONS & ALLERGIES:    Patient reports the following regarding taking any blood thinners:   Plavix? no Aspirin? no Coumadin? no Brilinta? no Xarelto? no Eliquis? no Pradaxa? no Savaysa? no Effient? no  Patient confirms/reports the following medications:  Current Outpatient Medications  Medication Sig Dispense Refill   Fish Oil-Cholecalciferol (FISH OIL + D3 PO) Take 1 capsule by mouth at bedtime.     LINZESS 290 MCG CAPS capsule TAKE 1 CAPSULE BY MOUTH ONCE DAILY WITH BREAKFAST 90 capsule 3   rOPINIRole (REQUIP) 1 MG tablet Take 1 mg by mouth as needed.  1   simvastatin (ZOCOR) 20 MG tablet Take 20 mg by mouth at bedtime.     No current facility-administered medications for this visit.    Patient confirms/reports the following allergies:  No Known Allergies  No orders of the defined types were placed in this encounter.   AUTHORIZATION INFORMATION Primary Insurance: [place],  [place] #: [id number],  Group #: [id number] Pre-Cert / Josem Kaufmann required: No, not required per [place] / Auth #:  Ref#: Weber Cooks  07/31/2021 8:48 EST  SCHEDULE INFORMATION: Procedure has been scheduled as follows:  Date: 08/08/2021, Time: 9:30  Location: [facility] with Dr. Abbey Chatters  This Gastroenterology Pre-Precedure Review Form is being routed to the following provider(s): Aliene Altes, PA-C

## 2021-07-28 ENCOUNTER — Telehealth: Payer: Self-pay | Admitting: Internal Medicine

## 2021-07-28 NOTE — Telephone Encounter (Signed)
Lmom for pt to call me back. 

## 2021-07-28 NOTE — Telephone Encounter (Signed)
Patient called with her insurance information, [phone]

## 2021-07-31 ENCOUNTER — Other Ambulatory Visit: Payer: Self-pay | Admitting: *Deleted

## 2021-07-31 NOTE — Progress Notes (Signed)
Spoke to [employer] at [employer].  She informed me that colonoscopies are covered at age 46. Ref#: [id number]

## 2021-08-08 ENCOUNTER — Encounter (HOSPITAL_COMMUNITY)
Admission: RE | Disposition: A | Discharge status: Home / Self Care | Payer: Self-pay | Source: Home / Self Care | Attending: Internal Medicine

## 2021-08-08 ENCOUNTER — Ambulatory Visit (HOSPITAL_COMMUNITY)
Admission: RE | Admit: 2021-08-08 | Discharge: 2021-08-08 | Disposition: A | Discharge status: Home / Self Care | Payer: 59 | Attending: Internal Medicine | Admitting: Internal Medicine

## 2021-08-08 ENCOUNTER — Ambulatory Visit (HOSPITAL_COMMUNITY): Payer: 59 | Admitting: Anesthesiology

## 2021-08-08 ENCOUNTER — Encounter (HOSPITAL_COMMUNITY): Payer: Self-pay

## 2021-08-08 ENCOUNTER — Other Ambulatory Visit: Payer: Self-pay

## 2021-08-08 DIAGNOSIS — Z1211 Encounter for screening for malignant neoplasm of colon: Secondary | ICD-10-CM

## 2021-08-08 DIAGNOSIS — K648 Other hemorrhoids: Secondary | ICD-10-CM | POA: Diagnosis not present

## 2021-08-08 HISTORY — PX: COLONOSCOPY WITH PROPOFOL: SHX5780

## 2021-08-08 SURGERY — COLONOSCOPY WITH PROPOFOL
Anesthesia: General

## 2021-08-08 MED ORDER — LACTATED RINGERS IV SOLN: INTRAVENOUS | Status: DC

## 2021-08-08 MED ORDER — LIDOCAINE HCL (CARDIAC) PF 100 MG/5ML IV SOSY
PREFILLED_SYRINGE | INTRAVENOUS | Status: DC | PRN
  Administered 2021-08-08: 50 mg via INTRAVENOUS

## 2021-08-08 MED ORDER — PROPOFOL 10 MG/ML IV BOLUS
INTRAVENOUS | Status: DC | PRN
  Administered 2021-08-08: 20 mg via INTRAVENOUS
  Administered 2021-08-08: 100 mg via INTRAVENOUS
  Administered 2021-08-08: 30 mg via INTRAVENOUS
  Administered 2021-08-08: 40 mg via INTRAVENOUS

## 2021-08-08 NOTE — Discharge Instructions (Addendum)

## 2021-08-08 NOTE — Anesthesia Preprocedure Evaluation (Signed)
Anesthesia Evaluation  Patient identified by MRN, date of birth, ID band Patient awake    Reviewed: Allergy & Precautions, H&P , NPO status , Patient's Chart, lab work & pertinent test results  Airway Mallampati: II  TM Distance: >3 FB Neck ROM: full    Dental no notable dental hx.    Pulmonary neg pulmonary ROS,    Pulmonary exam normal breath sounds clear to auscultation       Cardiovascular Exercise Tolerance: Good negative cardio ROS   Rhythm:regular Rate:Normal     Neuro/Psych negative neurological ROS  negative psych ROS   GI/Hepatic negative GI ROS, Neg liver ROS,   Endo/Other  negative endocrine ROS  Renal/GU negative Renal ROS  negative genitourinary   Musculoskeletal negative musculoskeletal ROS (+)   Abdominal   Peds  Hematology negative hematology ROS (+)   Anesthesia Other Findings   Reproductive/Obstetrics negative OB ROS                             Anesthesia Physical  Anesthesia Plan  ASA: 2  Anesthesia Plan: General   Post-op Pain Management:    Induction:   PONV Risk Score and Plan: TIVA  Airway Management Planned: Nasal Cannula  Additional Equipment:   Intra-op Plan:   Post-operative Plan:   Informed Consent: I have reviewed the patients History and Physical, chart, labs and discussed the procedure including the risks, benefits and alternatives for the proposed anesthesia with the patient or authorized representative who has indicated his/her understanding and acceptance.       Plan Discussed with: CRNA  Anesthesia Plan Comments:         Anesthesia Quick Evaluation

## 2021-08-08 NOTE — Op Note (Signed)
[facility] Patient Name: Chloe Orr Procedure Date: 08/08/2021 9:10 AM MRN: [id number] Date of Birth: 1975/07/26 Attending MD: Elon Alas. Abbey Chatters DO CSN: [id number] Age: 46 Admit Type: Outpatient Procedure:                Colonoscopy Indications:              Screening for colorectal malignant neoplasm Providers:                Elon Alas. Abbey Chatters, DO, Jessica Boudreaux, Hughie Closs, RN, Randa Spike, Technician Referring MD:              Medicines:                See the Anesthesia note for documentation of the                            administered medications Complications:            No immediate complications. Estimated Blood Loss:     Estimated blood loss: none. Procedure:                Pre-Anesthesia Assessment:                           - The anesthesia plan was to use monitored                            anesthesia care (MAC).                           After obtaining informed consent, the colonoscope                            was passed under direct vision. Throughout the                            procedure, the patient's blood pressure, pulse, and                            oxygen saturations were monitored continuously. The                            [id number]-HQ190L ([id number]) scope was introduced through                            the anus and advanced to the the cecum, identified                            by appendiceal orifice and ileocecal valve. The                            colonoscopy was performed without difficulty. The                            patient tolerated the procedure well.  The quality                            of the bowel preparation was evaluated using the                            BBPS [facility] Bowel Preparation Scale) with scores                            of: Right Colon = 3, Transverse Colon = 3 and Left                            Colon = 3 (entire mucosa seen well with no residual                             staining, small fragments of stool or opaque                            liquid). The total BBPS score equals 9. Scope In: 9:19:53 AM Scope Out: 9:30:01 AM Scope Withdrawal Time: 0 hours 8 minutes 16 seconds  Total Procedure Duration: 0 hours 10 minutes 8 seconds  Findings:      The perianal and digital rectal examinations were normal.      Non-bleeding internal hemorrhoids were found during endoscopy.      The entire examined colon appeared normal. Impression:               - Non-bleeding internal hemorrhoids.                           - The entire examined colon is normal.                           - No specimens collected. Moderate Sedation:      Per Anesthesia Care Recommendation:           - Patient has a contact number available for                            emergencies. The signs and symptoms of potential                            delayed complications were discussed with the                            patient. Return to normal activities tomorrow.                            Written discharge instructions were provided to the                            patient.                           - Resume previous diet.                           -  Continue present medications.                           - Repeat colonoscopy in 10 years for screening                            purposes.                           - Return to GI clinic PRN. Procedure Code(s):        --- Professional ---                           H6073, Colorectal cancer screening; colonoscopy on                            individual not meeting criteria for high risk Diagnosis Code(s):        --- Professional ---                           Z12.11, Encounter for screening for malignant                            neoplasm of colon                           K64.8, Other hemorrhoids CPT copyright 2019 American Medical Association. All rights reserved. The codes documented in this report are preliminary and upon coder review  may  be revised to meet current compliance requirements. Elon Alas. Abbey Chatters, DO [place] Abbey Chatters, DO 08/08/2021 9:31:30 AM This report has been signed electronically. Number of Addenda: 0

## 2021-08-08 NOTE — Transfer of Care (Signed)
Immediate Anesthesia Transfer of Care Note  Patient: Chloe Orr  Procedure(s) Performed: COLONOSCOPY WITH PROPOFOL  Patient Location: Endoscopy Unit  Anesthesia Type:General  Level of Consciousness: awake  Airway & Oxygen Therapy: Patient Spontanous Breathing  Post-op Assessment: Report given to RN and Post -op Vital signs reviewed and stable  Post vital signs: Reviewed and stable  Last Vitals:  Vitals Value Taken Time  BP    Temp    Pulse    Resp    SpO2      Last Pain:  Vitals:   08/08/21 0919  TempSrc:   PainSc: 0-No pain      Patients Stated Pain Goal: 10 (94/58/59 2924)  Complications: No notable events documented.

## 2021-08-08 NOTE — H&P (Signed)
Primary Care Physician:  Scherrie Bateman Primary Gastroenterologist:  Dr. Abbey Chatters  Pre-Procedure History & Physical: HPI:  Chloe Orr is a 46 y.o. female is here for first ever colonoscopy for colon cancer screening purposes.  Patient denies any family history of colorectal cancer.  No melena or hematochezia.  No abdominal pain or unintentional weight loss.  No change in bowel habits.  Overall feels well from a GI standpoint.  Past Medical History:  Diagnosis Date   Fibroid uterus    Hypercholesteremia     Past Surgical History:  Procedure Laterality Date   BREAST REDUCTION SURGERY Bilateral 09/20/2016   [facility]   CESAREAN SECTION  1996, 2010   [facility]   MASS EXCISION  2011   right arm mass excision   MASS EXCISION  02/11/2012   Procedure: EXCISION MASS;  Surgeon: Jamesetta So, MD;  Location: [facility];  Service: General;  Laterality: Left;  Excision Neoplasm Left Axilla   REDUCTION MAMMAPLASTY Bilateral    SALPINGOOPHORECTOMY Right 12/24/2017   Procedure: RIGHT SALPINGO OOPHORECTOMY;  Surgeon: [name], MD;  Location: [facility];  Service: Gynecology;  Laterality: Right;   SUPRACERVICAL ABDOMINAL HYSTERECTOMY N/A 12/24/2017   Procedure: HYSTERECTOMY SUPRACERVICAL ABDOMINAL;  Surgeon: [name], MD;  Location: [facility];  Service: Gynecology;  Laterality: N/A;   TUBAL LIGATION      Prior to Admission medications   Medication Sig Start Date End Date Taking? Authorizing Provider  LINZESS 290 MCG CAPS capsule TAKE 1 CAPSULE BY MOUTH ONCE DAILY WITH BREAKFAST Patient taking differently: Take 290 mcg by mouth daily as needed (constipation). 05/30/21  Yes Annitta Needs, NP  simvastatin (ZOCOR) 20 MG tablet Take 20 mg by mouth every other day.   Yes [provider]    Allergies as of 07/11/2021   (No Known Allergies)    Family History  Problem Relation Age of Onset   Diabetes Father    Birth defects Sister        cleft lip    Colon cancer Neg Hx     Gastric cancer Neg Hx    Esophageal cancer Neg Hx     Social History   Socioeconomic History   Marital status: Single    Spouse name: Not on file   Number of children: Not on file   Years of education: Not on file   Highest education level: Not on file  Occupational History   Not on file  Tobacco Use   Smoking status: Never   Smokeless tobacco: Never  Vaping Use   Vaping Use: Never used  Substance and Sexual Activity   Alcohol use: Yes    Comment: Currently (11/05/17): occasional glass of wine. Previously 3 beer daily, stopped drinking beer 10/21/17.   Drug use: No   Sexual activity: Not Currently    Birth control/protection: Surgical    Comment: supracervical hyst  Other Topics Concern   Not on file  Social History Narrative   STILL WORKING AS CNA. ONE DAUGHTER: BACK AT HOME, GRADUATED FROM [employer].   Social Determinants of Health   Financial Resource Strain: Not on file  Food Insecurity: Not on file  Transportation Needs: Not on file  Physical Activity: Not on file  Stress: Not on file  Social Connections: Not on file  Intimate Partner Violence: Not on file    Review of Systems: See HPI, otherwise negative ROS  Physical Exam: Vital signs in last 24 hours:  Temp:  [98.4 F (36.9 C)] 98.4 F (36.9 C) (01/17 0817) Resp:  [16] 16 (01/17 0817) BP: (134)/(85) 134/85 (01/17 0817) Weight:  [82.6 kg] 82.6 kg (01/17 0817)   General:   Alert,  Well-developed, well-nourished, pleasant and cooperative in NAD Head:  Normocephalic and atraumatic. Eyes:  Sclera clear, no icterus.   Conjunctiva pink. Ears:  Normal auditory acuity. Nose:  No deformity, discharge,  or lesions. Mouth:  No deformity or lesions, dentition normal. Neck:  Supple; no masses or thyromegaly. Lungs:  Clear throughout to auscultation.   No wheezes, crackles, or rhonchi. No acute distress. Heart:  Regular rate and rhythm; no murmurs, clicks, rubs,  or gallops. Abdomen:  Soft,  nontender and nondistended. No masses, hepatosplenomegaly or hernias noted. Normal bowel sounds, without guarding, and without rebound.   Msk:  Symmetrical without gross deformities. Normal posture. Extremities:  Without clubbing or edema. Neurologic:  Alert and  oriented x4;  grossly normal neurologically. Skin:  Intact without significant lesions or rashes. Cervical Nodes:  No significant cervical adenopathy. Psych:  Alert and cooperative. Normal mood and affect.  Impression/Plan: Chloe Orr is here for a colonoscopy to be performed for colon cancer screening purposes.  The risks of the procedure including infection, bleed, or perforation as well as benefits, limitations, alternatives and imponderables have been reviewed with the patient. Questions have been answered. All parties agreeable.

## 2021-08-08 NOTE — Anesthesia Postprocedure Evaluation (Signed)
Anesthesia Post Note  Patient: Chloe Orr  Procedure(s) Performed: COLONOSCOPY WITH PROPOFOL  Patient location during evaluation: Endoscopy Anesthesia Type: General Level of consciousness: awake and alert Pain management: pain level controlled Vital Signs Assessment: post-procedure vital signs reviewed and stable Respiratory status: spontaneous breathing, nonlabored ventilation, respiratory function stable and patient connected to nasal cannula oxygen Cardiovascular status: blood pressure returned to baseline and stable Postop Assessment: no apparent nausea or vomiting Anesthetic complications: no   No notable events documented.   Last Vitals:  Vitals:   08/08/21 0817 08/08/21 0932  BP: 134/85 94/62  Pulse:  77  Resp: 16 16  Temp: 36.9 C 36.8 C  SpO2:  98%    Last Pain:  Vitals:   08/08/21 0932  TempSrc: Oral  PainSc: 0-No pain                 Trixie Rude

## 2021-08-10 ENCOUNTER — Encounter (HOSPITAL_COMMUNITY): Payer: Self-pay | Admitting: Internal Medicine

## 2022-10-18 ENCOUNTER — Other Ambulatory Visit: Payer: Self-pay | Admitting: Gastroenterology

## 2022-10-18 DIAGNOSIS — K5900 Constipation, unspecified: Secondary | ICD-10-CM

## 2022-12-20 ENCOUNTER — Other Ambulatory Visit: Payer: Self-pay | Admitting: Gastroenterology

## 2022-12-20 DIAGNOSIS — K5900 Constipation, unspecified: Secondary | ICD-10-CM

## 2022-12-21 NOTE — Telephone Encounter (Signed)
We have not seen in office since 2021. She had colonoscopy in 2023. Needs ov to follow up constipation and for refills. Please arrange with me.

## 2023-01-16 ENCOUNTER — Ambulatory Visit: Payer: 59 | Admitting: Gastroenterology

## 2023-01-23 ENCOUNTER — Ambulatory Visit (INDEPENDENT_AMBULATORY_CARE_PROVIDER_SITE_OTHER): Payer: 59 | Admitting: Gastroenterology

## 2023-01-23 ENCOUNTER — Encounter: Payer: Self-pay | Admitting: Gastroenterology

## 2023-01-23 VITALS — BP 119/76 | HR 85 | Temp 98.5°F | Ht 59.0 in | Wt 200.0 lb

## 2023-01-23 DIAGNOSIS — K5900 Constipation, unspecified: Secondary | ICD-10-CM

## 2023-01-23 DIAGNOSIS — R7401 Elevation of levels of liver transaminase levels: Secondary | ICD-10-CM | POA: Diagnosis not present

## 2023-01-23 MED ORDER — LINACLOTIDE 290 MCG PO CAPS: 290.0000 ug | ORAL_CAPSULE | Freq: Every day | ORAL | 3 refills | Status: DC

## 2023-01-23 NOTE — Progress Notes (Signed)
GI Office Note    Referring Provider: Avis Epley, PA* Primary Care Physician:  Ladon Applebaum  Primary Gastroenterologist: Hennie Duos. Marletta Lor, DO   Chief Complaint   Chief Complaint  Patient presents with   Follow-up    Doing well    History of Present Illness   Chloe Orr is a 47 y.o. female presenting today for follow-up.  Last seen in October 2021.  She has a history of constipation and abnormal LFTs.  Doing well from a GI standpoint.  Constipation well-controlled.  Takes Linzess daily as needed.  Denies any abdominal pain, melena, rectal bleeding.  She completed colonoscopy last June.  No upper GI symptoms.  She has a history of elevated transaminases.In 2019, ultrasound with normal liver.  Autoimmune hepatitis workup negative.  Viral markers negative for hepatitis B and C.  She failed to complete follow-up labs.  Most recent labs performed with PCP dated July 2020 with total bilirubin of 0.9, alkaline phosphatase 89, AST 21, ALT 37.  Her total cholesterol at that time was 260, LDL 191.  She reports it has been a while since she saw her PCP.  I encouraged her to make a follow-up visit.  Colonoscopy January 2023 for screening showed nonbleeding internal hemorrhoids but otherwise unremarkable.  Next colonoscopy planned for 10 years.  Medications   Current Outpatient Medications  Medication Sig Dispense Refill   LINZESS 290 MCG CAPS capsule TAKE 1 CAPSULE BY MOUTH ONCE DAILY WITH BREAKFAST 90 capsule 0   simvastatin (ZOCOR) 20 MG tablet Take 20 mg by mouth every other day.     No current facility-administered medications for this visit.    Allergies   Allergies as of 01/23/2023   (No Known Allergies)      Review of Systems   General: Negative for anorexia, weight loss, fever, chills, fatigue, weakness. ENT: Negative for hoarseness, difficulty swallowing , nasal congestion. CV: Negative for chest pain, angina, palpitations, dyspnea on  exertion, peripheral edema.  Respiratory: Negative for dyspnea at rest, dyspnea on exertion, cough, sputum, wheezing.  GI: See history of present illness. GU:  Negative for dysuria, hematuria, urinary incontinence, urinary frequency, nocturnal urination.  Endo: Negative for unusual weight change.     Physical Exam   BP 119/76 (BP Location: Right Arm, Patient Position: Sitting, Cuff Size: Large)   Pulse 85   Temp 98.5 F (36.9 C) (Oral)   Ht 4\' 11"  (1.499 m)   Wt 200 lb (90.7 kg)   LMP 12/19/2017   SpO2 96%   BMI 40.40 kg/m    General: Well-nourished, well-developed in no acute distress.  Eyes: No icterus. Mouth: Oropharyngeal mucosa moist and pink  Abdomen: Bowel sounds are normal, nontender, nondistended, no hepatosplenomegaly or masses,  no abdominal bruits or hernia , no rebound or guarding.  Rectal: not performed Extremities: No lower extremity edema. No clubbing or deformities. Neuro: Alert and oriented x 4   Skin: Warm and dry, no jaundice.   Psych: Alert and cooperative, normal mood and affect.  Labs   See above  Imaging Studies   No results found.  Assessment   Constipation: Doing well.  Elevated LFTs: Due for follow-up.  Normal liver on ultrasound as outlined above.  Autoimmune workup negative.  Viral markers negative for hepatitis B and C.  Possibly related to fatty liver and/or statin use.   PLAN   LFTs. Continue Linzess 290 mcg daily as needed. Follow-up with PCP for physical. Return to  the office in 2 years or sooner if needed.  Leanna Battles. Melvyn Neth, MHS, PA-C [facility]

## 2023-01-23 NOTE — Patient Instructions (Signed)
Continue Linzess once daily as needed. Complete blood work at [facility] for follow up on your liver.  Return to our office in two years.

## 2023-01-24 LAB — HEPATIC FUNCTION PANEL
ALT: 37 IU/L — ABNORMAL HIGH (ref 0–32)
AST: 20 IU/L (ref 0–40)
Albumin: 4.6 g/dL (ref 3.9–4.9)
Alkaline Phosphatase: 93 IU/L (ref 44–121)
Bilirubin Total: 0.6 mg/dL (ref 0.0–1.2)
Bilirubin, Direct: 0.14 mg/dL (ref 0.00–0.40)
Total Protein: 7.5 g/dL (ref 6.0–8.5)

## 2023-02-01 ENCOUNTER — Other Ambulatory Visit: Payer: Self-pay

## 2023-02-01 DIAGNOSIS — R7989 Other specified abnormal findings of blood chemistry: Secondary | ICD-10-CM

## 2023-07-22 ENCOUNTER — Other Ambulatory Visit: Payer: Self-pay

## 2023-07-22 DIAGNOSIS — R7989 Other specified abnormal findings of blood chemistry: Secondary | ICD-10-CM

## 2023-07-31 ENCOUNTER — Other Ambulatory Visit: Payer: Self-pay

## 2023-07-31 DIAGNOSIS — R7989 Other specified abnormal findings of blood chemistry: Secondary | ICD-10-CM

## 2023-07-31 LAB — IRON,TIBC AND FERRITIN PANEL
Ferritin: 341 ng/mL — ABNORMAL HIGH (ref 15–150)
Iron Saturation: 31 % (ref 15–55)
Iron: 96 ug/dL (ref 27–159)
Total Iron Binding Capacity: 314 ug/dL (ref 250–450)
UIBC: 218 ug/dL (ref 131–425)

## 2023-07-31 LAB — HEPATIC FUNCTION PANEL
ALT: 33 [IU]/L — ABNORMAL HIGH (ref 0–32)
AST: 22 [IU]/L (ref 0–40)
Albumin: 5 g/dL — ABNORMAL HIGH (ref 3.9–4.9)
Alkaline Phosphatase: 91 [IU]/L (ref 44–121)
Bilirubin Total: 0.8 mg/dL (ref 0.0–1.2)
Bilirubin, Direct: 0.23 mg/dL (ref 0.00–0.40)
Total Protein: 7.7 g/dL (ref 6.0–8.5)

## 2023-11-16 ENCOUNTER — Ambulatory Visit: Admission: EM | Admit: 2023-11-16 | Discharge: 2023-11-16 | Disposition: A | Discharge status: Home / Self Care

## 2023-11-16 ENCOUNTER — Encounter: Payer: Self-pay | Admitting: *Deleted

## 2023-11-16 DIAGNOSIS — J069 Acute upper respiratory infection, unspecified: Secondary | ICD-10-CM | POA: Diagnosis not present

## 2023-11-16 DIAGNOSIS — Z8709 Personal history of other diseases of the respiratory system: Secondary | ICD-10-CM

## 2023-11-16 LAB — POC COVID19/FLU A&B COMBO
Covid Antigen, POC: NEGATIVE
Influenza A Antigen, POC: NEGATIVE
Influenza B Antigen, POC: NEGATIVE

## 2023-11-16 MED ORDER — ALBUTEROL SULFATE HFA 108 (90 BASE) MCG/ACT IN AERS: 2.0000 | INHALATION_SPRAY | Freq: Four times a day (QID) | RESPIRATORY_TRACT | 0 refills | Status: AC | PRN

## 2023-11-16 MED ORDER — PSEUDOEPH-BROMPHEN-DM 30-2-10 MG/5ML PO SYRP: 5.0000 mL | ORAL_SOLUTION | Freq: Four times a day (QID) | ORAL | 0 refills | Status: DC | PRN

## 2023-11-16 MED ORDER — CETIRIZINE HCL 10 MG PO TABS: 10.0000 mg | ORAL_TABLET | Freq: Every day | ORAL | 0 refills | Status: AC

## 2023-11-16 MED ORDER — FLUTICASONE PROPIONATE 50 MCG/ACT NA SUSP: 2.0000 | Freq: Every day | NASAL | 0 refills | Status: DC

## 2023-11-16 NOTE — ED Triage Notes (Signed)
 Pt states yesterday she started coughing and felt weak. She states she is coughing so much her chest is aching. She has took a [facility] today for headache.

## 2023-11-16 NOTE — Discharge Instructions (Addendum)
 The COVID/flu test was negative. Take medication as prescribed. Increase fluids and allow for plenty of rest. May take over-the-counter Tylenol  or ibuprofen  as needed for pain, fever, or general discomfort. Recommend the use of normal saline nasal spray throughout the day for nasal congestion and runny nose. For your cough, recommend use of a humidifier in your bedroom at nighttime during sleep and sleeping elevated on pillows while symptoms persist.   Symptoms should improve over the next 10 to 14 days.  If symptoms fail to improve, or appear to be worsening, he may follow-up in this clinic or with your primary care physician for further evaluation. Follow-up as needed.

## 2023-11-16 NOTE — ED Provider Notes (Signed)
 [facility]    CSN: [id number] Arrival date & time: 11/16/23  0950      History   Chief Complaint Chief Complaint  Patient presents with   Cough   Headache    HPI Chloe Orr is a 48 y.o. female.   The history is provided by the patient.   Patient presents with a 1-2 day history of cough, headache, and nasal congestion.  Patient endorses underlying history of seasonal allergies.  She denies fever, chills, chest pain, abdominal pain, nausea, vomiting, diarrhea, or rash.  Patient states that she has had intermittent wheezing.  States that she took Benadryl  which did not help, also states that she took a BC powder for her headache.  She states she is not taking any medications for her cough.  She does not take medications regularly for her allergies at this time. 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   S/P subtotal hysterectomy 12/24/2017   Constipation 11/05/2017   Bloating 11/05/2017   Menorrhagia with irregular cycle 10/05/2015   Shingles outbreak 09/02/2014    Past Surgical History:  Procedure Laterality Date   BREAST REDUCTION SURGERY Bilateral 09/20/2016   [facility]   CESAREAN SECTION  1996, 2010   [facility]   COLONOSCOPY WITH PROPOFOL  N/A 08/08/2021   Procedure: COLONOSCOPY WITH PROPOFOL ;  Surgeon: Vinetta Greening, DO;  Location: [facility];  Service: Endoscopy;  Laterality: N/A;  9:30 / ASA II   MASS EXCISION  2011   right arm mass excision   MASS EXCISION  02/11/2012   Procedure: EXCISION MASS;  Surgeon: Beau Bound, MD;  Location: [facility];  Service: General;  Laterality: Left;  Excision Neoplasm Left  Axilla   REDUCTION MAMMAPLASTY Bilateral    SALPINGOOPHORECTOMY Right 12/24/2017   Procedure: RIGHT SALPINGO OOPHORECTOMY;  Surgeon: Albino Hum, MD;  Location: [facility];  Service: Gynecology;  Laterality: Right;   SUPRACERVICAL ABDOMINAL HYSTERECTOMY N/A 12/24/2017   Procedure: HYSTERECTOMY SUPRACERVICAL ABDOMINAL;  Surgeon: Albino Hum, MD;  Location: [facility];  Service: Gynecology;  Laterality: N/A;   TUBAL LIGATION      OB History     Gravida  3   Para  2   Term  2   Preterm      AB  1   Living  2      SAB      IAB      Ectopic      Multiple      Live Births               Home Medications    Prior to Admission medications   Medication Sig Start Date End Date Taking? Authorizing Provider  ALPRAZolam (XANAX) 0.5 MG tablet Take 0.5 mg by mouth at bedtime as needed. 11/01/23  Yes [provider]  buPROPion (WELLBUTRIN XL) 300 MG 24 hr tablet Take 300 mg by mouth daily. 11/01/23  Yes [provider]  linaclotide  (LINZESS ) 290 MCG CAPS capsule Take 1 capsule (290 mcg total) by mouth daily with breakfast. 01/23/23  Yes Lanney Pitts, PA-C  rosuvastatin (CRESTOR) 20 MG tablet Take by mouth. 10/10/23  Yes  [provider]  simvastatin  (ZOCOR ) 20 MG tablet Take 20 mg by mouth every other day.   Yes [provider]    Family History Family History  Problem Relation Age of Onset   Diabetes Father    Birth defects Sister        cleft lip    Colon cancer Neg Hx    Gastric cancer Neg Hx    Esophageal cancer Neg Hx     Social History Social History   Tobacco Use   Smoking status: Never   Smokeless tobacco: Never  Vaping Use   Vaping status: Never Used  Substance Use Topics   Alcohol use: Yes    Comment: Currently (11/05/17): occasional glass of wine. Previously 3 beer daily, stopped drinking beer 10/21/17.   Drug use: No     Allergies   Patient has no known allergies.   Review of Systems Review of Systems Per  HPI  Physical Exam Triage Vital Signs ED Triage Vitals  Encounter Vitals Group     BP 11/16/23 1018 123/75     Systolic BP Percentile --      Diastolic BP Percentile --      Pulse Rate 11/16/23 1018 90     Resp 11/16/23 1018 18     Temp 11/16/23 1018 98.9 F (37.2 C)     Temp Source 11/16/23 1018 Oral     SpO2 11/16/23 1018 95 %     Weight --      Height --      Head Circumference --      Peak Flow --      Pain Score 11/16/23 1016 0     Pain Loc --      Pain Education --      Exclude from Growth Chart --    No data found.  Updated Vital Signs BP 123/75 (BP Location: Right Arm)   Pulse 90   Temp 98.9 F (37.2 C) (Oral)   Resp 18   LMP 12/19/2017   SpO2 95%   Visual Acuity Right Eye Distance:   Left Eye Distance:   Bilateral Distance:    Right Eye Near:   Left Eye Near:    Bilateral Near:     Physical Exam Vitals and nursing note reviewed.  Constitutional:      General: She is not in acute distress.    Appearance: Normal appearance.  HENT:     Head: Normocephalic.     Right Ear: Tympanic membrane, ear canal and external ear normal.     Left Ear: Tympanic membrane, ear canal and external ear normal.     Nose: Congestion present.     Right Turbinates: Enlarged and swollen.     Left Turbinates: Enlarged and swollen.     Right Sinus: No maxillary sinus tenderness or frontal sinus tenderness.     Left Sinus: No maxillary sinus tenderness or frontal sinus tenderness.     Mouth/Throat:     Lips: Pink.     Mouth: Mucous membranes are moist.     Pharynx: Uvula midline. Postnasal drip present. No pharyngeal swelling, oropharyngeal exudate, posterior oropharyngeal erythema or uvula swelling.     Comments: Cobblestoning present to posterior oropharynx  Eyes:     Extraocular Movements: Extraocular movements intact.     Pupils: Pupils are equal, round, and reactive to light.  Cardiovascular:     Rate and Rhythm: Normal rate and regular rhythm.     Pulses: Normal  pulses.  Heart sounds: Normal heart sounds.  Pulmonary:     Effort: Pulmonary effort is normal. No respiratory distress.     Breath sounds: Normal breath sounds. No stridor. No wheezing, rhonchi or rales.  Abdominal:     General: Bowel sounds are normal.     Palpations: Abdomen is soft.     Tenderness: There is no abdominal tenderness.  Musculoskeletal:     Cervical back: Normal range of motion.  Skin:    General: Skin is warm and dry.  Neurological:     General: No focal deficit present.     Mental Status: She is alert and oriented to person, place, and time.  Psychiatric:        Mood and Affect: Mood normal.        Behavior: Behavior normal.      UC Treatments / Results  Labs (all labs ordered are listed, but only abnormal results are displayed) Labs Reviewed  POC COVID19/FLU A&B COMBO    EKG   Radiology No results found.  Procedures Procedures (including critical care time)  Medications Ordered in UC Medications - No data to display  Initial Impression / Assessment and Plan / UC Course  I have reviewed the triage vital signs and the nursing notes.  Pertinent labs & imaging results that were available during my care of the patient were reviewed by me and considered in my medical decision making (see chart for details).  COVID/flu test was negative.  Symptoms consistent with allergic rhinitis, although cannot rule out underlying viral URI.  Will provide symptomatic treatment with cetirizine  10 mg, Bromfed-DM for the cough, and fluticasone  50 mcg nasal spray.  Albuterol inhaler was prescribed for intermittent wheezing.  Supportive care recommendations were provided and discussed with the patient to include fluids, rest, over-the-counter analgesics, normal saline nasal spray, and use of a humidifier at nighttime during sleep.  Discussed indications with patient regarding follow-up.  Patient was in agreement with this plan of care and verbalizes understanding.  All  questions were answered.  Patient stable for discharge.  Final Clinical Impressions(s) / UC Diagnoses   Final diagnoses:  Viral URI with cough  History of allergic rhinitis     Discharge Instructions      The COVID/flu test was negative. Take medication as prescribed. Increase fluids and allow for plenty of rest. May take over-the-counter Tylenol  or ibuprofen  as needed for pain, fever, or general discomfort. Recommend the use of normal saline nasal spray throughout the day for nasal congestion and runny nose. For your cough, recommend use of a humidifier in your bedroom at nighttime during sleep and sleeping elevated on pillows while symptoms persist.   Symptoms should improve over the next 10 to 14 days.  If symptoms fail to improve, or appear to be worsening, he may follow-up in this clinic or with your primary care physician for further evaluation. Follow-up as needed.     ED Prescriptions   None    PDMP not reviewed this encounter.   Hardy Lia, NP 11/16/23 1059

## 2023-11-19 ENCOUNTER — Other Ambulatory Visit: Payer: Self-pay

## 2023-11-19 DIAGNOSIS — R7989 Other specified abnormal findings of blood chemistry: Secondary | ICD-10-CM

## 2023-11-22 ENCOUNTER — Ambulatory Visit: Payer: 59 | Admitting: Gastroenterology

## 2023-11-27 ENCOUNTER — Telehealth: Payer: Self-pay | Admitting: *Deleted

## 2023-11-27 ENCOUNTER — Encounter: Payer: Self-pay | Admitting: Gastroenterology

## 2023-11-27 ENCOUNTER — Ambulatory Visit (INDEPENDENT_AMBULATORY_CARE_PROVIDER_SITE_OTHER): Payer: 59 | Admitting: Gastroenterology

## 2023-11-27 VITALS — BP 125/84 | HR 79 | Temp 98.2°F | Ht 59.0 in | Wt 183.0 lb

## 2023-11-27 DIAGNOSIS — R7989 Other specified abnormal findings of blood chemistry: Secondary | ICD-10-CM

## 2023-11-27 DIAGNOSIS — K5900 Constipation, unspecified: Secondary | ICD-10-CM | POA: Diagnosis not present

## 2023-11-27 DIAGNOSIS — R1900 Intra-abdominal and pelvic swelling, mass and lump, unspecified site: Secondary | ICD-10-CM | POA: Insufficient documentation

## 2023-11-27 DIAGNOSIS — F1090 Alcohol use, unspecified, uncomplicated: Secondary | ICD-10-CM

## 2023-11-27 DIAGNOSIS — R140 Abdominal distension (gaseous): Secondary | ICD-10-CM

## 2023-11-27 LAB — CBC WITH DIFFERENTIAL/PLATELET
Basophils Absolute: 0 10*3/uL (ref 0.0–0.2)
Basos: 1 %
EOS (ABSOLUTE): 0.2 10*3/uL (ref 0.0–0.4)
Eos: 2 %
Hematocrit: 42.5 % (ref 34.0–46.6)
Hemoglobin: 13.8 g/dL (ref 11.1–15.9)
Immature Grans (Abs): 0.1 10*3/uL (ref 0.0–0.1)
Immature Granulocytes: 1 %
Lymphocytes Absolute: 2.1 10*3/uL (ref 0.7–3.1)
Lymphs: 24 %
MCH: 29.7 pg (ref 26.6–33.0)
MCHC: 32.5 g/dL (ref 31.5–35.7)
MCV: 92 fL (ref 79–97)
Monocytes Absolute: 0.6 10*3/uL (ref 0.1–0.9)
Monocytes: 7 %
Neutrophils Absolute: 5.8 10*3/uL (ref 1.4–7.0)
Neutrophils: 65 %
Platelets: 299 10*3/uL (ref 150–450)
RBC: 4.64 x10E6/uL (ref 3.77–5.28)
RDW: 12.5 % (ref 11.7–15.4)
WBC: 8.8 10*3/uL (ref 3.4–10.8)

## 2023-11-27 LAB — IRON,TIBC AND FERRITIN PANEL
Ferritin: 473 ng/mL — ABNORMAL HIGH (ref 15–150)
Iron Saturation: 21 % (ref 15–55)
Iron: 61 ug/dL (ref 27–159)
Total Iron Binding Capacity: 285 ug/dL (ref 250–450)
UIBC: 224 ug/dL (ref 131–425)

## 2023-11-27 LAB — HEPATIC FUNCTION PANEL
ALT: 52 IU/L — ABNORMAL HIGH (ref 0–32)
AST: 25 IU/L (ref 0–40)
Albumin: 4.6 g/dL (ref 3.9–4.9)
Alkaline Phosphatase: 99 IU/L (ref 44–121)
Bilirubin Total: 0.8 mg/dL (ref 0.0–1.2)
Bilirubin, Direct: 0.24 mg/dL (ref 0.00–0.40)
Total Protein: 7.3 g/dL (ref 6.0–8.5)

## 2023-11-27 LAB — ALPHA-1-ANTITRYPSIN: A-1 Antitrypsin: 142 mg/dL (ref 101–187)

## 2023-11-27 LAB — TISSUE TRANSGLUTAMINASE, IGA

## 2023-11-27 LAB — PROTIME-INR
INR: 1 (ref 0.9–1.2)
Prothrombin Time: 10.9 s (ref 9.1–12.0)

## 2023-11-27 LAB — CERULOPLASMIN: Ceruloplasmin: 30.5 mg/dL (ref 19.0–39.0)

## 2023-11-27 NOTE — Patient Instructions (Addendum)
 Continue Linzess  290 mcg daily. CT abdomen/pelvis to evaluate both liver and lower abdomen. Continue with efforts for weight loss.   Recommend further reduction in alcohol use. Return office visit in six months.

## 2023-11-27 NOTE — Progress Notes (Signed)
 GI Office Note    Referring Provider: Roxene Cora, PA* Primary Care Physician:  Earlene Gleason  Primary Gastroenterologist: Rolando Cliche. Mordechai April, DO   Chief Complaint   Chief Complaint  Patient presents with   Follow-up    History of Present Illness   Chloe Orr is a 48 y.o. female presenting today for follow up. Last seen 01/2023. H/o constipation and abnormal LFTs.   Mildly elevated ALT. Ferritin 341 with iron sat 31.  Previous autoimmune hepatitis markers neg. Hep C Ab, Hep B surf Ag neg. Not immune to Hep A or B. Recommended vaccination.   Labs 11/25/23: WBC 8.8, hemoglobin 13.8, platelets 299,000.  Albumin 4.6, total bilirubin 0.8, alk phos 99, AST 25, ALT 52H, INR 1, TIBC 285, iron 61, iron sat 21%, ferritin 473, alpha 1 antitrypsin 142, ceruloplasmin 30.5, TTG IgA less than 2 previous IgA normal.  Today: Weight down 17 pounds in the past year.  Doing intermittent fasting.  Does not eat until noon.  No red meats.  Notes that she came out of work in January due to depression.  She increased her alcohol consumption at that time up to 3 beers each night.  Currently drinking 2 beers each night.  Feels like she needs it to help her sleep.  She has had some bloating, comes and goes.  Denies excess gas.  Bowel movements doing well on Linzess .  No melena or rectal bleeding.  No heartburn.  No vomiting.  No dysuria.    Colonoscopy January 2023 for screening showed nonbleeding internal hemorrhoids but otherwise unremarkable.  Next colonoscopy planned for 10 years.   Medications   Current Outpatient Medications  Medication Sig Dispense Refill   albuterol  (VENTOLIN  HFA) 108 (90 Base) MCG/ACT inhaler Inhale 2 puffs into the lungs every 6 (six) hours as needed. 8 g 0   ALPRAZolam (XANAX) 0.5 MG tablet Take 0.5 mg by mouth at bedtime as needed.     buPROPion (WELLBUTRIN XL) 300 MG 24 hr tablet Take 300 mg by mouth daily.     cetirizine  (ZYRTEC ) 10 MG tablet Take 1  tablet (10 mg total) by mouth daily. 30 tablet 0   linaclotide  (LINZESS ) 290 MCG CAPS capsule Take 1 capsule (290 mcg total) by mouth daily with breakfast. 90 capsule 3   rosuvastatin (CRESTOR) 40 MG tablet Take 40 mg by mouth daily.     traZODone (DESYREL) 100 MG tablet Take 50-100 mg by mouth at bedtime.     No current facility-administered medications for this visit.    Allergies   Allergies as of 11/27/2023   (No Known Allergies)         Review of Systems   General: Negative for anorexia, unintentional weight loss, fever, chills, fatigue, weakness. ENT: Negative for hoarseness, difficulty swallowing , nasal congestion. CV: Negative for chest pain, angina, palpitations, dyspnea on exertion, peripheral edema.  Respiratory: Negative for dyspnea at rest, dyspnea on exertion, cough, sputum, wheezing.  GI: See history of present illness. GU:  Negative for dysuria, hematuria, urinary incontinence, urinary frequency, nocturnal urination.  Endo: Negative for unusual weight change.     Physical Exam   BP 125/84 (BP Location: Right Arm, Patient Position: Sitting, Cuff Size: Normal)   Pulse 79   Temp 98.2 F (36.8 C) (Oral)   Ht 4\' 11"  (1.499 m)   Wt 183 lb (83 kg)   LMP 12/19/2017   SpO2 97%   BMI 36.96 kg/m  General: Well-nourished, well-developed in no acute distress.  Eyes: No icterus. Mouth: Oropharyngeal mucosa moist and pink   Lungs: Clear to auscultation bilaterally.  Heart: Regular rate and rhythm, no murmurs rubs or gallops.  Abdomen: Bowel sounds are normal, nontender, nondistended, no hepatosplenomegaly, no abdominal bruits or hernia , no rebound or guarding. Over the lower midline incision she has fullness/hardening to the area, mass like. No obvious hernia Rectal: not performed  Extremities: No lower extremity edema. No clubbing or deformities. Neuro: Alert and oriented x 4   Skin: Warm and dry, no jaundice.   Psych: Alert and cooperative, normal mood and  affect.  Labs   See hpi  Imaging Studies   No results found.  Assessment/Plan:   Constipation: doing well on Linzess .  Elevated LFTs: ALT remains elevated as well as ferritin. Work up as outlined above. Increase in ferritin likely due to increase in etoh use. Elevated ALT possibly due to fatty liver and/or statin.  -encouraged weight loss efforts -encouraged etoh reduction  -continue statin -update liver imaging -return ov in six months  Lower abdominal mass on exam: I suspect fullness/hardness right over her midline incision due to scar tissue. I did not appreciate a hernia.  -CT A/P with contrast  Trudie Fuse. Harles Lied, MHS, PA-C [facility]

## 2023-11-27 NOTE — Telephone Encounter (Signed)
Spoke with pt and is aware of CT appt details

## 2023-11-27 NOTE — Telephone Encounter (Signed)
 LMOVM to call back to give CT appt for 5/16, arrival 12:30pm   Checked with [employer] website and it states no PA is required.

## 2023-12-06 ENCOUNTER — Ambulatory Visit (HOSPITAL_COMMUNITY)
Admission: RE | Admit: 2023-12-06 | Discharge: 2023-12-06 | Disposition: A | Discharge status: Home / Self Care | Source: Ambulatory Visit | Attending: Gastroenterology | Admitting: Gastroenterology

## 2023-12-06 DIAGNOSIS — R1900 Intra-abdominal and pelvic swelling, mass and lump, unspecified site: Secondary | ICD-10-CM | POA: Insufficient documentation

## 2023-12-06 DIAGNOSIS — R140 Abdominal distension (gaseous): Secondary | ICD-10-CM | POA: Diagnosis present

## 2023-12-06 DIAGNOSIS — K5900 Constipation, unspecified: Secondary | ICD-10-CM | POA: Diagnosis present

## 2023-12-06 DIAGNOSIS — R7989 Other specified abnormal findings of blood chemistry: Secondary | ICD-10-CM | POA: Insufficient documentation

## 2023-12-06 MED ORDER — IOHEXOL 300 MG/ML  SOLN
100.0000 mL | Freq: Once | INTRAMUSCULAR | Status: AC | PRN
  Administered 2023-12-06: 100 mL via INTRAVENOUS

## 2023-12-09 ENCOUNTER — Ambulatory Visit: Payer: Self-pay | Admitting: Gastroenterology

## 2023-12-10 ENCOUNTER — Other Ambulatory Visit: Payer: Self-pay | Admitting: *Deleted

## 2023-12-10 DIAGNOSIS — E279 Disorder of adrenal gland, unspecified: Secondary | ICD-10-CM

## 2023-12-10 DIAGNOSIS — N83292 Other ovarian cyst, left side: Secondary | ICD-10-CM

## 2023-12-10 DIAGNOSIS — K769 Liver disease, unspecified: Secondary | ICD-10-CM

## 2023-12-10 NOTE — Addendum Note (Signed)
 Addended by: Alvester Johnson on: 12/10/2023 02:53 PM   Modules accepted: Orders

## 2023-12-14 ENCOUNTER — Ambulatory Visit (HOSPITAL_COMMUNITY)
Admission: RE | Admit: 2023-12-14 | Discharge: 2023-12-14 | Disposition: A | Discharge status: Home / Self Care | Source: Ambulatory Visit | Attending: Gastroenterology | Admitting: Gastroenterology

## 2023-12-14 ENCOUNTER — Encounter (HOSPITAL_COMMUNITY): Payer: Self-pay

## 2023-12-14 DIAGNOSIS — K769 Liver disease, unspecified: Secondary | ICD-10-CM

## 2023-12-14 DIAGNOSIS — E279 Disorder of adrenal gland, unspecified: Secondary | ICD-10-CM | POA: Insufficient documentation

## 2023-12-14 DIAGNOSIS — N83292 Other ovarian cyst, left side: Secondary | ICD-10-CM | POA: Diagnosis present

## 2023-12-14 MED ORDER — GADOBUTROL 1 MMOL/ML IV SOLN
8.0000 mL | Freq: Once | INTRAVENOUS | Status: AC | PRN
Start: 2023-12-14 — End: 2023-12-14
  Administered 2023-12-14: 8 mL via INTRAVENOUS

## 2023-12-16 ENCOUNTER — Ambulatory Visit: Payer: Self-pay | Admitting: Gastroenterology

## 2024-01-31 ENCOUNTER — Ambulatory Visit
Admission: EM | Admit: 2024-01-31 | Discharge: 2024-01-31 | Disposition: A | Discharge status: Home / Self Care | Attending: Family Medicine | Admitting: Family Medicine

## 2024-01-31 DIAGNOSIS — J0300 Acute streptococcal tonsillitis, unspecified: Secondary | ICD-10-CM

## 2024-01-31 LAB — POCT RAPID STREP A (OFFICE): Rapid Strep A Screen: POSITIVE — AB

## 2024-01-31 MED ORDER — LIDOCAINE VISCOUS HCL 2 % MT SOLN: 10.0000 mL | OROMUCOSAL | 0 refills | Status: AC | PRN

## 2024-01-31 MED ORDER — AMOXICILLIN 875 MG PO TABS: 875.0000 mg | ORAL_TABLET | Freq: Two times a day (BID) | ORAL | 0 refills | Status: AC

## 2024-01-31 NOTE — ED Provider Notes (Signed)
 [facility]    CSN: [id number] Arrival date & time: 01/31/24  0955      History   Chief Complaint Chief Complaint  Patient presents with   Sore Throat    HPI Chloe Orr is a 48 y.o. female.   Presenting today with 1 day history of sore swollen feeling throat, neck soreness, fever, fatigue.  Denies congestion, cough, chest pain, shortness of breath, abdominal pain, vomiting, diarrhea.  So far not trying anything over-the-counter for symptoms.  Grandson diagnosed with strep yesterday.    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   S/P subtotal hysterectomy 12/24/2017   Constipation 11/05/2017   Bloating 11/05/2017   Menorrhagia with irregular cycle 10/05/2015   Shingles outbreak 09/02/2014    Past Surgical History:  Procedure Laterality Date   BREAST REDUCTION SURGERY Bilateral 09/20/2016   [facility]   CESAREAN SECTION  1996, 2010   [facility]   COLONOSCOPY WITH PROPOFOL  N/A 08/08/2021   Procedure: COLONOSCOPY WITH PROPOFOL ;  Surgeon: Cindie Carlin POUR, DO;  Location: [facility];  Service: Endoscopy;  Laterality: N/A;  9:30 / ASA II   MASS EXCISION  2011   right arm mass excision   MASS EXCISION  02/11/2012   Procedure: EXCISION MASS;  Surgeon: Oneil DELENA Budge, MD;  Location: [facility];  Service: General;  Laterality: Left;  Excision Neoplasm Left Axilla   REDUCTION MAMMAPLASTY Bilateral    SALPINGOOPHORECTOMY Right 12/24/2017   Procedure: RIGHT SALPINGO OOPHORECTOMY;  Surgeon: Edsel Norleen GAILS, MD;  Location: [facility];  Service: Gynecology;   Laterality: Right;   SUPRACERVICAL ABDOMINAL HYSTERECTOMY N/A 12/24/2017   Procedure: HYSTERECTOMY SUPRACERVICAL ABDOMINAL;  Surgeon: Edsel Norleen GAILS, MD;  Location: [facility];  Service: Gynecology;  Laterality: N/A;   TUBAL LIGATION      OB History     Gravida  3   Para  2   Term  2   Preterm      AB  1   Living  2      SAB      IAB      Ectopic      Multiple      Live Births               Home Medications    Prior to Admission medications   Medication Sig Start Date End Date Taking? Authorizing Provider  amoxicillin  (AMOXIL ) 875 MG tablet Take 1 tablet (875 mg total) by mouth 2 (two) times daily. 01/31/24  Yes Stuart Vernell Norris, PA-C  lidocaine  (XYLOCAINE ) 2 % solution Use as directed 10 mLs in the mouth or throat every 3 (three) hours as needed. 01/31/24  Yes Stuart Vernell Norris, PA-C  albuterol  (VENTOLIN  HFA) 108 [phone] Base) MCG/ACT inhaler Inhale 2 puffs into the lungs every 6 (six) hours as needed. 11/16/23   Leath-Warren, Etta PARAS, NP  ALPRAZolam (XANAX) 0.5 MG tablet Take 0.5 mg by mouth at bedtime as needed. 11/01/23   [provider]  buPROPion (WELLBUTRIN XL) 300 MG 24 hr tablet Take 300 mg by mouth daily. 11/01/23  [provider]  cetirizine  (ZYRTEC ) 10 MG tablet Take 1 tablet (10 mg total) by mouth daily. 11/16/23   Leath-Warren, Etta PARAS, NP  linaclotide  (LINZESS ) 290 MCG CAPS capsule Take 1 capsule (290 mcg total) by mouth daily with breakfast. 01/23/23   Ezzard Sonny RAMAN, PA-C  rosuvastatin (CRESTOR) 40 MG tablet Take 40 mg by mouth daily. 11/13/23   [provider]  traZODone (DESYREL) 100 MG tablet Take 50-100 mg by mouth at bedtime. 11/01/23   [provider]    Family History Family History  Problem Relation Age of Onset   Diabetes Father    Birth defects Sister        cleft lip    Liver cancer Paternal Aunt        obese   Liver disease Cousin    Liver disease Cousin        fatty liver, not obese    Colon cancer Neg Hx    Gastric cancer Neg Hx    Esophageal cancer Neg Hx     Social History Social History   Tobacco Use   Smoking status: Never   Smokeless tobacco: Never  Vaping Use   Vaping status: Never Used  Substance Use Topics   Alcohol use: Yes    Comment: Currently (11/05/17): occasional glass of wine. Previously 3 beer daily, stopped drinking beer 10/21/17.  As of 11/27/2023, drinking 24 ounces of beer each night   Drug use: No     Allergies   Patient has no known allergies.   Review of Systems Review of Systems Per HPI  Physical Exam Triage Vital Signs ED Triage Vitals  Encounter Vitals Group     BP 01/31/24 1008 137/77     Girls Systolic BP Percentile --      Girls Diastolic BP Percentile --      Boys Systolic BP Percentile --      Boys Diastolic BP Percentile --      Pulse Rate 01/31/24 1008 (!) 108     Resp 01/31/24 1008 16     Temp 01/31/24 1008 99.7 F (37.6 C)     Temp Source 01/31/24 1008 Oral     SpO2 01/31/24 1008 95 %     Weight --      Height --      Head Circumference --      Peak Flow --      Pain Score 01/31/24 1009 9     Pain Loc --      Pain Education --      Exclude from Growth Chart --    No data found.  Updated Vital Signs BP 137/77 (BP Location: Right Arm)   Pulse (!) 108   Temp 99.7 F (37.6 C) (Oral)   Resp 16   LMP 12/19/2017   SpO2 95%   Visual Acuity Right Eye Distance:   Left Eye Distance:   Bilateral Distance:    Right Eye Near:   Left Eye Near:    Bilateral Near:     Physical Exam Vitals and nursing note reviewed.  Constitutional:      Appearance: Normal appearance. She is not ill-appearing.  HENT:     Head: Atraumatic.     Nose: Nose normal.     Mouth/Throat:     Mouth: Mucous membranes are moist.     Pharynx: Oropharyngeal exudate and posterior oropharyngeal erythema present.  Eyes:     Extraocular Movements: Extraocular movements intact.     Conjunctiva/sclera:  Conjunctivae normal.   Cardiovascular:     Rate and Rhythm: Normal rate and regular rhythm.     Heart sounds: Normal heart sounds.  Pulmonary:     Effort: Pulmonary effort is normal.     Breath sounds: Normal breath sounds.  Musculoskeletal:        General: Normal range of motion.     Cervical back: Normal range of motion and neck supple.  Lymphadenopathy:     Cervical: Cervical adenopathy present.  Skin:    General: Skin is warm and dry.  Neurological:     Mental Status: She is alert and oriented to person, place, and time.  Psychiatric:        Mood and Affect: Mood normal.        Thought Content: Thought content normal.        Judgment: Judgment normal.      UC Treatments / Results  Labs (all labs ordered are listed, but only abnormal results are displayed) Labs Reviewed  POCT RAPID STREP A (OFFICE) - Abnormal; Notable for the following components:      Result Value   Rapid Strep A Screen Positive (*)    All other components within normal limits    EKG   Radiology No results found.  Procedures Procedures (including critical care time)  Medications Ordered in UC Medications - No data to display  Initial Impression / Assessment and Plan / UC Course  I have reviewed the triage vital signs and the nursing notes.  Pertinent labs & imaging results that were available during my care of the patient were reviewed by me and considered in my medical decision making (see chart for details).     Low-grade fever and tachycardia in triage otherwise vital signs reassuring.  Rapid strep positive, treat with Amoxil , viscous lidocaine , supportive over-the-counter medications at home care.  Return for worsening symptoms.  Final Clinical Impressions(s) / UC Diagnoses   Final diagnoses:  Strep tonsillitis   Discharge Instructions   None    ED Prescriptions     Medication Sig Dispense Auth. Provider   amoxicillin  (AMOXIL ) 875 MG tablet Take 1 tablet (875 mg total) by mouth 2 (two) times  daily. 20 tablet Stuart Vernell Norris, PA-C   lidocaine  (XYLOCAINE ) 2 % solution Use as directed 10 mLs in the mouth or throat every 3 (three) hours as needed. 100 mL Stuart Vernell Norris, [place]      PDMP not reviewed this encounter.   Stuart Vernell Norris, [place] 01/31/24 1055

## 2024-01-31 NOTE — ED Triage Notes (Signed)
 Pt states sore throat since last night.  States she took care of grandson yesterday and he tested positive for strep throat.

## 2024-03-02 ENCOUNTER — Other Ambulatory Visit (HOSPITAL_COMMUNITY)
Admission: RE | Admit: 2024-03-02 | Discharge: 2024-03-02 | Disposition: A | Discharge status: Home / Self Care | Source: Ambulatory Visit | Attending: Family Medicine | Admitting: Family Medicine

## 2024-03-02 DIAGNOSIS — Z111 Encounter for screening for respiratory tuberculosis: Secondary | ICD-10-CM | POA: Diagnosis present

## 2024-03-04 LAB — QUANTIFERON-TB GOLD PLUS: QuantiFERON-TB Gold Plus: NEGATIVE

## 2024-03-04 LAB — QUANTIFERON-TB GOLD PLUS (RQFGPL)
QuantiFERON Mitogen Value: >10 [IU]/mL
QuantiFERON Nil Value: 0.01 [IU]/mL
QuantiFERON TB1 Ag Value: 0.01 [IU]/mL
QuantiFERON TB2 Ag Value: 0.02 [IU]/mL

## 2024-04-20 ENCOUNTER — Other Ambulatory Visit: Payer: Self-pay | Admitting: Gastroenterology

## 2024-04-20 DIAGNOSIS — K5900 Constipation, unspecified: Secondary | ICD-10-CM

## 2024-05-07 ENCOUNTER — Encounter: Payer: Self-pay | Admitting: Gastroenterology

## 2024-06-03 ENCOUNTER — Ambulatory Visit: Admitting: Gastroenterology

## 2024-06-04 ENCOUNTER — Encounter: Payer: Self-pay | Admitting: Gastroenterology
